# Patient Record
Sex: Male | Born: 1990 | Race: Black or African American | Hispanic: No | Marital: Single | Smoking: Never smoker
Health system: Southern US, Community
[De-identification: ages and names within clinical notes are randomized; demographics above are authoritative.]

## PROBLEM LIST (undated history)

## (undated) DIAGNOSIS — R198 Other specified symptoms and signs involving the digestive system and abdomen: Secondary | ICD-10-CM

## (undated) DIAGNOSIS — F419 Anxiety disorder, unspecified: Secondary | ICD-10-CM

## (undated) DIAGNOSIS — F79 Unspecified intellectual disabilities: Secondary | ICD-10-CM

## (undated) DIAGNOSIS — R229 Localized swelling, mass and lump, unspecified: Secondary | ICD-10-CM

## (undated) DIAGNOSIS — R1310 Dysphagia, unspecified: Secondary | ICD-10-CM

## (undated) DIAGNOSIS — Z789 Other specified health status: Secondary | ICD-10-CM

## (undated) DIAGNOSIS — J189 Pneumonia, unspecified organism: Secondary | ICD-10-CM

## (undated) DIAGNOSIS — R479 Unspecified speech disturbances: Secondary | ICD-10-CM

## (undated) HISTORY — PX: MULTIPLE TOOTH EXTRACTIONS: SHX2053

---

## 2012-09-28 ENCOUNTER — Emergency Department (HOSPITAL_COMMUNITY)
Admission: EM | Admit: 2012-09-28 | Discharge: 2012-09-28 | Disposition: A | Discharge status: Home / Self Care | Payer: Medicaid Other | Source: Home / Self Care

## 2012-09-28 ENCOUNTER — Encounter (HOSPITAL_COMMUNITY): Payer: Self-pay | Admitting: *Deleted

## 2012-09-28 ENCOUNTER — Emergency Department (INDEPENDENT_AMBULATORY_CARE_PROVIDER_SITE_OTHER): Payer: Medicaid Other

## 2012-09-28 DIAGNOSIS — J189 Pneumonia, unspecified organism: Secondary | ICD-10-CM

## 2012-09-28 MED ORDER — AZITHROMYCIN 250 MG PO TABS: 250.0000 mg | ORAL_TABLET | Freq: Every day | ORAL | Status: DC

## 2012-09-28 MED ORDER — CEFTRIAXONE SODIUM 1 G IJ SOLR
1.0000 g | Freq: Once | INTRAMUSCULAR | Status: AC
  Administered 2012-09-28: 1 g via INTRAMUSCULAR

## 2012-09-28 MED ORDER — CEFTRIAXONE SODIUM 1 G IJ SOLR
INTRAMUSCULAR | Status: AC
  Filled 2012-09-28: qty 10

## 2012-09-28 NOTE — ED Provider Notes (Signed)
Medical screening examination/treatment/procedure(s) were performed by resident physician or non-physician practitioner and as supervising physician I was immediately available for consultation/collaboration.   Barkley Bruns MD.   Linna Hoff, MD 09/28/12 [phone]

## 2012-09-28 NOTE — ED Notes (Signed)
Pt  Has  Symptoms  Of  Fever  Cough   Congestion  For  sev   Days           He  Also  Has  Had  Some  Vomiting as  Well  Caregiver  With the  patient

## 2012-09-28 NOTE — ED Provider Notes (Signed)
History     CSN: [id number]  Arrival date & time 09/28/12  1103   First MD Initiated Contact with Patient 09/28/12 1116      Chief Complaint  Patient presents with  . 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 Last night he was complaining of upper mid chest pain. The highest is home temperature measured was 101. He has had no diarrhea or abdominal pain he also developed a cough last p.m.   Past Medical History  Diagnosis Date  . Retardation     History reviewed. No pertinent past surgical history.  History reviewed. No pertinent family history.  History  Substance Use Topics  . Smoking status: Not on file  . Smokeless tobacco: Not on file  . Alcohol Use: No      Review of Systems  Unable to perform ROS Constitutional: Positive for fever and activity change. Negative for chills.    Allergies  Review of patient's allergies indicates no known allergies.  Home Medications   Current Outpatient Rx  Name  Route  Sig  Dispense  Refill  . azithromycin (ZITHROMAX) 250 MG tablet   Oral   Take 1 tablet (250 mg total) by mouth daily. 2 tabs po on day 1, 1 tab po on days 2-5   6 tablet   0     BP 118/72  Pulse 102  Temp(Src) 100.5 F (38.1 C) (Oral)  Resp 18  SpO2 98%  Physical Exam  Nursing note and vitals reviewed. Constitutional: He is oriented to person, place, and time. He appears well-developed and well-nourished. No distress.  HENT:  Head: Normocephalic.  Right Ear: External ear normal.  Left Ear: External ear normal.  Patient is uncooperative and will not allow exam, the oropharynx.  Neck: Normal range of motion. Neck supple.  Cardiovascular: Normal rate, regular rhythm and normal heart sounds.   Pulmonary/Chest: Effort  normal and breath sounds normal. No respiratory distress. He has no wheezes. He has no rales.  Abdominal: Soft. There is no tenderness. There is no rebound.  Musculoskeletal: Normal range of motion. He exhibits no edema and no tenderness.  Lymphadenopathy:    He has no cervical adenopathy.  Neurological: He is alert and oriented to person, place, and time.  Skin: Skin is warm and dry. No rash noted.  Psychiatric: He has a normal mood and affect.    ED Course  Procedures (including critical care time)  Labs Reviewed - No data to display Dg Chest 2 View  09/28/2012  *RADIOLOGY REPORT*  Clinical Data: Fever, cough  CHEST - 2 VIEW  Comparison: None.  Findings: Cardiomediastinal silhouette is stable.  There is patchy airspace disease in the left lower lobe highly suspicious for infiltrate/pneumonia.  Follow-up to resolution after treatment is recommended.  IMPRESSION: Patchy airspace disease in the left lower lobe highly suspicious for pneumonia.  Follow-up to resolution after treatment is recommended.   Original Report Authenticated By: Natasha Mead, M.D.      1. Pneumonia       MDM  Rocephin 1 g IM prior to discharge Azithromycin-Z-Pak Make sure all the medicine is completed. Drink plenty of fluids stay well hydrated Tylenol every 4 hours as needed for fever and discomfort Followup with his doctor next week for recheck. Any new symptoms problems or worsening may return or go to the emergency department.  Hayden Rasmussen, NP 09/28/12 1249

## 2013-10-06 ENCOUNTER — Emergency Department (HOSPITAL_COMMUNITY)
Admission: EM | Admit: 2013-10-06 | Discharge: 2013-10-06 | Disposition: A | Discharge status: Home / Self Care | Payer: Medicaid Other | Source: Home / Self Care | Attending: Family Medicine | Admitting: Family Medicine

## 2013-10-06 ENCOUNTER — Encounter (HOSPITAL_COMMUNITY): Payer: Self-pay | Admitting: Emergency Medicine

## 2013-10-06 DIAGNOSIS — K529 Noninfective gastroenteritis and colitis, unspecified: Secondary | ICD-10-CM

## 2013-10-06 DIAGNOSIS — K5289 Other specified noninfective gastroenteritis and colitis: Secondary | ICD-10-CM

## 2013-10-06 MED ORDER — ONDANSETRON 4 MG PO TBDP
4.0000 mg | ORAL_TABLET | Freq: Once | ORAL | Status: AC
  Administered 2013-10-06: 4 mg via ORAL

## 2013-10-06 MED ORDER — ONDANSETRON 4 MG PO TBDP
ORAL_TABLET | ORAL | Status: AC
  Filled 2013-10-06: qty 1

## 2013-10-06 MED ORDER — ONDANSETRON HCL 4 MG PO TABS: 4.0000 mg | ORAL_TABLET | Freq: Four times a day (QID) | ORAL | Status: DC

## 2013-10-06 NOTE — ED Notes (Signed)
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

## 2013-10-06 NOTE — Discharge Instructions (Signed)
Clear liquid , bland diet tonight as tolerated, advance on tues as improved, use medicine as needed, return or see your doctor if any problems. °

## 2013-10-06 NOTE — ED Provider Notes (Signed)
CSN: [id number]     Arrival date & time 10/06/13  1824 History   First MD Initiated Contact with Patient 10/06/13 1957     Chief Complaint  Patient presents with  . Fever   (Consider location/radiation/quality/duration/timing/severity/associated sxs/prior Treatment) Patient is a 23 y.o. male presenting with vomiting. The history is provided by a parent.  Emesis Severity:  Mild Duration:  1 day Quality:  Stomach contents Progression:  Improving Chronicity:  New Recent urination:  Normal Associated symptoms: fever   Associated symptoms: no abdominal pain, no cough, no diarrhea and no sore throat     Past Medical History  Diagnosis Date  . Retardation    History reviewed. No pertinent past surgical history. History reviewed. No pertinent family history. History  Substance Use Topics  . Smoking status: Never Smoker   . Smokeless tobacco: Not on file  . Alcohol Use: No    Review of Systems  Constitutional: Positive for fever.  HENT: Negative.  Negative for sore throat.   Gastrointestinal: Positive for nausea and vomiting. Negative for abdominal pain, diarrhea and constipation.  Genitourinary: Negative.     Allergies  Review of patient's allergies indicates no known allergies.  Home Medications   Current Outpatient Rx  Name  Route  Sig  Dispense  Refill  . azithromycin (ZITHROMAX) 250 MG tablet   Oral   Take 1 tablet (250 mg total) by mouth daily. 2 tabs po on day 1, 1 tab po on days 2-5   6 tablet   0   . ondansetron (ZOFRAN) 4 MG tablet   Oral   Take 1 tablet (4 mg total) by mouth every 6 (six) hours. Prn n/v   6 tablet   0    BP 123/71  Pulse 92  Temp(Src) 98.6 F (37 C) (Oral)  Resp 16  SpO2 98% Physical Exam  Nursing note and vitals reviewed. Constitutional: He is oriented to person, place, and time. He appears well-developed and well-nourished. No distress.  HENT:  Mouth/Throat: Oropharynx is clear and moist.  Eyes: Pupils are equal, round, and  reactive to light.  Neck: Normal range of motion. Neck supple.  Cardiovascular: Normal heart sounds and intact distal pulses.   Pulmonary/Chest: Effort normal and breath sounds normal.  Abdominal: Soft. Bowel sounds are normal. He exhibits no distension and no mass. There is no tenderness. There is no rebound and no guarding.  Lymphadenopathy:    He has no cervical adenopathy.  Neurological: He is alert and oriented to person, place, and time.  Skin: Skin is warm and dry.    ED Course  Procedures (including critical care time) Labs Review Labs Reviewed - No data to display Imaging Review No results found.   MDM   1. Gastroenteritis, acute        Linna HoffJames D Yida Hyams, MD 10/06/13 2038

## 2014-04-07 DIAGNOSIS — IMO0002 Reserved for concepts with insufficient information to code with codable children: Secondary | ICD-10-CM

## 2014-04-07 HISTORY — DX: Reserved for concepts with insufficient information to code with codable children: IMO0002

## 2014-04-16 ENCOUNTER — Encounter (HOSPITAL_BASED_OUTPATIENT_CLINIC_OR_DEPARTMENT_OTHER): Payer: Self-pay | Admitting: *Deleted

## 2014-04-20 ENCOUNTER — Ambulatory Visit (HOSPITAL_BASED_OUTPATIENT_CLINIC_OR_DEPARTMENT_OTHER): Payer: Medicaid Other | Admitting: Anesthesiology

## 2014-04-20 ENCOUNTER — Ambulatory Visit (HOSPITAL_BASED_OUTPATIENT_CLINIC_OR_DEPARTMENT_OTHER)
Admission: RE | Admit: 2014-04-20 | Discharge: 2014-04-20 | Disposition: A | Discharge status: Home / Self Care | Payer: Medicaid Other | Source: Ambulatory Visit | Attending: Specialist | Admitting: Specialist

## 2014-04-20 ENCOUNTER — Encounter (HOSPITAL_BASED_OUTPATIENT_CLINIC_OR_DEPARTMENT_OTHER)
Admission: RE | Disposition: A | Discharge status: Home / Self Care | Payer: Self-pay | Source: Ambulatory Visit | Attending: Specialist

## 2014-04-20 ENCOUNTER — Encounter (HOSPITAL_BASED_OUTPATIENT_CLINIC_OR_DEPARTMENT_OTHER): Payer: Medicaid Other | Admitting: Anesthesiology

## 2014-04-20 ENCOUNTER — Encounter (HOSPITAL_BASED_OUTPATIENT_CLINIC_OR_DEPARTMENT_OTHER): Payer: Self-pay | Admitting: *Deleted

## 2014-04-20 DIAGNOSIS — L723 Sebaceous cyst: Secondary | ICD-10-CM | POA: Insufficient documentation

## 2014-04-20 DIAGNOSIS — F79 Unspecified intellectual disabilities: Secondary | ICD-10-CM | POA: Insufficient documentation

## 2014-04-20 HISTORY — DX: Localized swelling, mass and lump, unspecified: R22.9

## 2014-04-20 HISTORY — DX: Unspecified intellectual disabilities: F79

## 2014-04-20 HISTORY — PX: MASS EXCISION: SHX2000

## 2014-04-20 HISTORY — DX: Dysphagia, unspecified: R13.10

## 2014-04-20 HISTORY — DX: Other specified symptoms and signs involving the digestive system and abdomen: R19.8

## 2014-04-20 HISTORY — DX: Unspecified speech disturbances: R47.9

## 2014-04-20 SURGERY — EXCISION MASS
Anesthesia: General | Laterality: Bilateral

## 2014-04-20 MED ORDER — PROPOFOL 10 MG/ML IV BOLUS
INTRAVENOUS | Status: DC | PRN
  Administered 2014-04-20: 120 mg via INTRAVENOUS

## 2014-04-20 MED ORDER — FENTANYL CITRATE 0.05 MG/ML IJ SOLN
INTRAMUSCULAR | Status: AC
  Filled 2014-04-20: qty 4

## 2014-04-20 MED ORDER — MIDAZOLAM HCL 2 MG/ML PO SYRP: 12.0000 mg | ORAL_SOLUTION | Freq: Once | ORAL | Status: DC | PRN

## 2014-04-20 MED ORDER — KETAMINE HCL 100 MG/ML IJ SOLN
INTRAMUSCULAR | Status: AC
  Filled 2014-04-20: qty 1

## 2014-04-20 MED ORDER — CEFAZOLIN SODIUM-DEXTROSE 2-3 GM-% IV SOLR
2.0000 g | INTRAVENOUS | Status: AC
Start: 2014-04-20 — End: 2014-04-20
  Administered 2014-04-20: 2 g via INTRAVENOUS

## 2014-04-20 MED ORDER — ONDANSETRON HCL 4 MG/2ML IJ SOLN
INTRAMUSCULAR | Status: DC | PRN
  Administered 2014-04-20: 4 mg via INTRAVENOUS

## 2014-04-20 MED ORDER — LIDOCAINE-EPINEPHRINE 0.5 %-1:200000 IJ SOLN
INTRAMUSCULAR | Status: DC | PRN
  Administered 2014-04-20: 15 mL

## 2014-04-20 MED ORDER — FENTANYL CITRATE 0.05 MG/ML IJ SOLN
INTRAMUSCULAR | Status: DC | PRN
  Administered 2014-04-20: 50 ug via INTRAVENOUS

## 2014-04-20 MED ORDER — LACTATED RINGERS IV SOLN
INTRAVENOUS | Status: DC
  Administered 2014-04-20: 08:00:00 via INTRAVENOUS

## 2014-04-20 MED ORDER — SUCCINYLCHOLINE CHLORIDE 20 MG/ML IJ SOLN
INTRAMUSCULAR | Status: DC | PRN
  Administered 2014-04-20: 100 mg via INTRAVENOUS

## 2014-04-20 MED ORDER — DEXAMETHASONE SODIUM PHOSPHATE 4 MG/ML IJ SOLN
INTRAMUSCULAR | Status: DC | PRN
  Administered 2014-04-20: 8 mg via INTRAVENOUS

## 2014-04-20 MED ORDER — MIDAZOLAM HCL 2 MG/2ML IJ SOLN
INTRAMUSCULAR | Status: AC
  Filled 2014-04-20: qty 2

## 2014-04-20 MED ORDER — LIDOCAINE-EPINEPHRINE 0.5 %-1:200000 IJ SOLN
INTRAMUSCULAR | Status: AC
  Filled 2014-04-20: qty 2

## 2014-04-20 MED ORDER — FENTANYL CITRATE 0.05 MG/ML IJ SOLN: 50.0000 ug | INTRAMUSCULAR | Status: DC | PRN

## 2014-04-20 MED ORDER — KETAMINE HCL 100 MG/ML IJ SOLN
INTRAMUSCULAR | Status: DC | PRN
  Administered 2014-04-20: 250 mg via INTRAMUSCULAR

## 2014-04-20 MED ORDER — LIDOCAINE HCL (CARDIAC) 20 MG/ML IV SOLN
INTRAVENOUS | Status: DC | PRN
  Administered 2014-04-20: 50 mg via INTRAVENOUS

## 2014-04-20 MED ORDER — MIDAZOLAM HCL 5 MG/5ML IJ SOLN
INTRAMUSCULAR | Status: DC | PRN
  Administered 2014-04-20: 2.5 mg via INTRAMUSCULAR

## 2014-04-20 MED ORDER — BACITRACIN-NEOMYCIN-POLYMYXIN 400-5-5000 EX OINT
TOPICAL_OINTMENT | CUTANEOUS | Status: AC
  Filled 2014-04-20: qty 1

## 2014-04-20 MED ORDER — MIDAZOLAM HCL 2 MG/2ML IJ SOLN: 1.0000 mg | INTRAMUSCULAR | Status: DC | PRN

## 2014-04-20 MED ORDER — HYDROMORPHONE HCL PF 1 MG/ML IJ SOLN: 0.2500 mg | INTRAMUSCULAR | Status: DC | PRN

## 2014-04-20 MED ORDER — CEFAZOLIN SODIUM-DEXTROSE 2-3 GM-% IV SOLR
INTRAVENOUS | Status: AC
Start: 2014-04-20 — End: 2014-04-20
  Filled 2014-04-20: qty 50

## 2014-04-20 SURGICAL SUPPLY — 65 items
BAG DECANTER FOR FLEXI CONT (MISCELLANEOUS) ×3 IMPLANT
BANDAGE ELASTIC 4 VELCRO ST LF (GAUZE/BANDAGES/DRESSINGS) IMPLANT
BENZOIN TINCTURE PRP APPL 2/3 (GAUZE/BANDAGES/DRESSINGS) IMPLANT
BLADE KNIFE PERSONA 10 (BLADE) ×3 IMPLANT
BLADE KNIFE PERSONA 15 (BLADE) ×3 IMPLANT
BLADE SURG 11 STRL SS (BLADE) ×3 IMPLANT
BNDG COHESIVE 4X5 TAN STRL (GAUZE/BANDAGES/DRESSINGS) IMPLANT
BNDG COHESIVE 4X5 WHT NS (GAUZE/BANDAGES/DRESSINGS) ×6 IMPLANT
BNDG GAUZE ELAST 4 BULKY (GAUZE/BANDAGES/DRESSINGS) IMPLANT
CANISTER SUCT 1200ML W/VALVE (MISCELLANEOUS) ×3 IMPLANT
CLOSURE WOUND 1/2 X4 (GAUZE/BANDAGES/DRESSINGS)
COTTONBALL LRG STERILE PKG (GAUZE/BANDAGES/DRESSINGS) IMPLANT
COVER MAYO STAND STRL (DRAPES) ×3 IMPLANT
COVER TABLE BACK 60X90 (DRAPES) ×3 IMPLANT
DRAPE LAPAROSCOPIC ABDOMINAL (DRAPES) IMPLANT
DRAPE U-SHAPE 76X120 STRL (DRAPES) ×3 IMPLANT
DRSG PAD ABDOMINAL 8X10 ST (GAUZE/BANDAGES/DRESSINGS) ×3 IMPLANT
ELECT NEEDLE TIP 2.8 STRL (NEEDLE) ×3 IMPLANT
ELECT REM PT RETURN 9FT ADLT (ELECTROSURGICAL) ×3
ELECTRODE REM PT RTRN 9FT ADLT (ELECTROSURGICAL) ×1 IMPLANT
FILTER 7/8 IN (FILTER) IMPLANT
GAUZE SPONGE 4X4 12PLY STRL (GAUZE/BANDAGES/DRESSINGS) ×3 IMPLANT
GAUZE SPONGE 4X4 16PLY XRAY LF (GAUZE/BANDAGES/DRESSINGS) IMPLANT
GAUZE XEROFORM 1X8 LF (GAUZE/BANDAGES/DRESSINGS) ×3 IMPLANT
GAUZE XEROFORM 5X9 LF (GAUZE/BANDAGES/DRESSINGS) IMPLANT
GLOVE BIO SURGEON STRL SZ 6.5 (GLOVE) ×2 IMPLANT
GLOVE BIO SURGEONS STRL SZ 6.5 (GLOVE) ×1
GLOVE BIOGEL M STRL SZ7.5 (GLOVE) ×3 IMPLANT
GLOVE BIOGEL PI IND STRL 8 (GLOVE) ×1 IMPLANT
GLOVE BIOGEL PI INDICATOR 8 (GLOVE) ×2
GLOVE ECLIPSE 7.0 STRL STRAW (GLOVE) ×3 IMPLANT
GOWN STRL REUS W/ TWL LRG LVL3 (GOWN DISPOSABLE) IMPLANT
GOWN STRL REUS W/ TWL XL LVL3 (GOWN DISPOSABLE) ×2 IMPLANT
GOWN STRL REUS W/TWL LRG LVL3 (GOWN DISPOSABLE)
GOWN STRL REUS W/TWL XL LVL3 (GOWN DISPOSABLE) ×4
NEEDLE HYPO 25X1 1.5 SAFETY (NEEDLE) ×3 IMPLANT
PACK BASIN DAY SURGERY FS (CUSTOM PROCEDURE TRAY) ×3 IMPLANT
SHEET MEDIUM DRAPE 40X70 STRL (DRAPES) IMPLANT
SHEETING SILICONE GEL EPI DERM (MISCELLANEOUS) IMPLANT
SLEEVE SCD COMPRESS KNEE MED (MISCELLANEOUS) ×3 IMPLANT
SPONGE GAUZE 4X4 12PLY STER LF (GAUZE/BANDAGES/DRESSINGS) IMPLANT
SPONGE LAP 18X18 X RAY DECT (DISPOSABLE) ×3 IMPLANT
STAPLER VISISTAT 35W (STAPLE) IMPLANT
STOCKINETTE 4X48 STRL (DRAPES) IMPLANT
STRIP CLOSURE SKIN 1/2X4 (GAUZE/BANDAGES/DRESSINGS) IMPLANT
SUCTION FRAZIER TIP 10 FR DISP (SUCTIONS) ×3 IMPLANT
SUT ETHILON 5 0 PS 2 18 (SUTURE) ×3 IMPLANT
SUT MNCRL AB 3-0 PS2 18 (SUTURE) ×3 IMPLANT
SUT MON AB 2-0 CT1 36 (SUTURE) IMPLANT
SUT PROLENE 4 0 P 3 18 (SUTURE) IMPLANT
SUT PROLENE 4 0 PS 2 18 (SUTURE) ×3 IMPLANT
SUT SILK 3 0 PS 1 (SUTURE) IMPLANT
SUT VICRYL RAPID 5 0 P 3 (SUTURE) ×6 IMPLANT
SYR 20CC LL (SYRINGE) ×3 IMPLANT
SYR 50ML LL SCALE MARK (SYRINGE) IMPLANT
SYR CONTROL 10ML LL (SYRINGE) ×3 IMPLANT
TAPE HYPAFIX 6 X30' (GAUZE/BANDAGES/DRESSINGS)
TAPE HYPAFIX 6X30 (GAUZE/BANDAGES/DRESSINGS) IMPLANT
TOWEL OR 17X24 6PK STRL BLUE (TOWEL DISPOSABLE) ×6 IMPLANT
TRAY DSU PREP LF (CUSTOM PROCEDURE TRAY) ×3 IMPLANT
TUBE CONNECTING 20'X1/4 (TUBING)
TUBE CONNECTING 20X1/4 (TUBING) IMPLANT
UNDERPAD 30X30 INCONTINENT (UNDERPADS AND DIAPERS) IMPLANT
VAC PENCILS W/TUBING CLEAR (MISCELLANEOUS) ×3 IMPLANT
YANKAUER SUCT BULB TIP NO VENT (SUCTIONS) ×3 IMPLANT

## 2014-04-20 NOTE — H&P (Signed)
Bobby Huber is an 23 y.o. male.   Chief Complaint: Patient with mental retardation and masses right and left ears [id number] masses right and left ears with increased growth  Past Medical History  Diagnosis Date  . Mental retardation   . Mass 04/2014    bilateral ear lobe  . Speech impediment     speech is not clear, difficult to understand  . Difficulty swallowing pills     Past Surgical History  Procedure Laterality Date  . Multiple tooth extractions      History reviewed. No pertinent family history. Social History:  reports that he has never smoked. He has never used smokeless tobacco. He reports that he does not drink alcohol or use illicit drugs.  Allergies: No Known Allergies  No prescriptions prior to admission    No results found for this or any previous visit (from the past 48 hour(s)). No results found.  Review of Systems  Constitutional: Negative.   HENT: Negative.   Eyes: Negative.   Respiratory: Negative.   Cardiovascular: Negative.   Gastrointestinal: Negative.   Genitourinary: Negative.   Musculoskeletal: Negative.   Skin: Negative.   Neurological: Negative.   Endo/Heme/Allergies: Negative.   Psychiatric/Behavioral: The patient is nervous/anxious.     Blood pressure 124/81, pulse 78, temperature 98 F (36.7 C), temperature source Oral, resp. rate 16, height  (1.702 m), weight 74.844 kg (165 lb), SpO2 100.00%. Physical Exam   Assessment/Plan Masses right and left ears for excision and plastic closure  Bobby Huber 04/20/2014, 7:25 AM

## 2014-04-20 NOTE — Anesthesia Postprocedure Evaluation (Signed)
  Anesthesia Post-op Note  Patient: [employer]  Procedure(s) Performed: Procedure(s): EXCISION OF  MASSES BILATERAL POSTERIOR EAR LOBE WITH PLASTIC CLOSURE (Bilateral)  Patient Location: PACU  Anesthesia Type:General  Level of Consciousness: awake and alert   Airway and Oxygen Therapy: Patient Spontanous Breathing  Post-op Pain: none  Post-op Assessment: Post-op Vital signs reviewed, Patient's Cardiovascular Status Stable and Respiratory Function Stable  Post-op Vital Signs: Reviewed  Filed Vitals:   04/20/14 0915  BP: 128/68  Pulse: 120  Temp:   Resp: 18    Complications: No apparent anesthesia complications

## 2014-04-20 NOTE — Anesthesia Procedure Notes (Signed)
Procedure Name: Intubation Date/Time: 04/20/2014 7:45 AM Performed by: Caren Macadam Pre-anesthesia Checklist: Patient identified, Emergency Drugs available, Suction available and Patient being monitored Patient Re-evaluated:Patient Re-evaluated prior to inductionOxygen Delivery Method: Circle System Utilized Preoxygenation: Pre-oxygenation with 100% oxygen Intubation Type: IV induction Ventilation: Mask ventilation without difficulty Laryngoscope Size: Miller and 2 Grade View: Grade I Tube type: Oral Tube size: 7.0 mm Number of attempts: 1 Airway Equipment and Method: stylet and oral airway Placement Confirmation: ETT inserted through vocal cords under direct vision,  positive ETCO2 and breath sounds checked- equal and bilateral Secured at: 24 cm Tube secured with: Tape Dental Injury: Teeth and Oropharynx as per pre-operative assessment

## 2014-04-20 NOTE — Brief Op Note (Signed)
04/20/2014  8:35 AM  PATIENT:  Bobby Huber  24 y.o. 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, face, and neck   POST-OPERATIVE DIAGNOSIS:  Other benign neoplasm of connective and other soft tissue o  PROCEDURE:  Procedure(s): EXCISION OF  MASSES BILATERAL POSTERIOR EAR LOBE WITH PLASTIC CLOSURE (Bilateral)  SURGEON:  Surgeon(s) and Role:    * Louisa Second, MD - Primary  PHYSICIAN ASSISTANT:   ASSISTANTS: none   ANESTHESIA:   general  EBL:  Total I/O In: 600 [I.V.:600] Out: -   BLOOD ADMINISTERED:none  DRAINS: none   LOCAL MEDICATIONS USED:  LIDOCAINE   SPECIMEN:  Excision  DISPOSITION OF SPECIMEN:  PATHOLOGY  COUNTS:  YES  TOURNIQUET:  * No tourniquets in log *  DICTATION: .Other Dictation: Dictation Number [id number]  PLAN OF CARE: Discharge to home after PACU  PATIENT DISPOSITION:  PACU - hemodynamically stable.   Delay start of Pharmacological VTE agent (>24hrs) due to surgical blood loss or risk of bleeding: not applicable

## 2014-04-20 NOTE — Transfer of Care (Signed)
Immediate Anesthesia Transfer of Care Note  Patient: Bobby Huber  Procedure(s) Performed: Procedure(s): EXCISION OF  MASSES BILATERAL POSTERIOR EAR LOBE WITH PLASTIC CLOSURE (Bilateral)  Patient Location: PACU  Anesthesia Type:General  Level of Consciousness: sedated  Airway & Oxygen Therapy: Patient Spontanous Breathing and Patient connected to face mask oxygen  Post-op Assessment: Report given to PACU RN and Post -op Vital signs reviewed and stable  Post vital signs: Reviewed and stable  Complications: No apparent anesthesia complications

## 2014-04-20 NOTE — Discharge Instructions (Signed)
Activity (include date of return to work if known) As tolerated: NO showers NO driving No heavy activities  Diet:regular No restrictions:  Wound Care: Keep dressing clean & dry  Do not change dressings For Abdominoplasties wear abdominal binder Special Instructions: Do not raise arms up Continue to empty, recharge, & record drainage from J-P drains &/or Hemovacs 2-3 times a day, as needed. Call Doctor if any unusual problems occur such as pain, excessive Bleeding, unrelieved Nausea/vomiting, Fever &/or chills When lying down, keep head elevated on 2-3 pillows or back-rest For Addominoplasties the Jack-knife position Follow-up appointment: Please call the office.  The patient received discharge instruction from:___________________________________________      Patient signature ________________________________________ / Date___________    Signature of individual providing instructions/ Date________________             Post Anesthesia Home Care Instructions  Activity: Get plenty of rest for the remainder of the day. A responsible adult should stay with you for 24 hours following the procedure.  For the next 24 hours, DO NOT: -Drive a car -Advertising copywriter -Drink alcoholic beverages -Take any medication unless instructed by your physician -Make any legal decisions or sign important papers.  Meals: Start with liquid foods such as gelatin or soup. Progress to regular foods as tolerated. Avoid greasy, spicy, heavy foods. If nausea and/or vomiting occur, drink only clear liquids until the nausea and/or vomiting subsides. Call your physician if vomiting continues.  Special Instructions/Symptoms: Your throat may feel dry or sore from the anesthesia or the breathing tube placed in your throat during surgery. If this causes discomfort, gargle with warm salt water. The discomfort should disappear within 24 hours.    Make an appointment with Dr. Shon Hough next Tuesday September  22.

## 2014-04-20 NOTE — Anesthesia Preprocedure Evaluation (Signed)
Anesthesia Evaluation  Patient identified by MRN, date of birth, ID band Patient awake    Reviewed: Allergy & Precautions, H&P , NPO status , Patient's Chart, lab work & pertinent test results  Airway       Dental no notable dental hx.    Pulmonary neg pulmonary ROS,    Pulmonary exam normal       Cardiovascular negative cardio ROS      Neuro/Psych Mental retardation negative psych ROS   GI/Hepatic negative GI ROS, Neg liver ROS,   Endo/Other  negative endocrine ROS  Renal/GU negative Renal ROS  negative genitourinary   Musculoskeletal   Abdominal   Peds  Hematology negative hematology ROS (+)   Anesthesia Other Findings   Reproductive/Obstetrics negative OB ROS                           Anesthesia Physical Anesthesia Plan  ASA: III  Anesthesia Plan: General   Post-op Pain Management:    Induction: Inhalational  Airway Management Planned: LMA  Additional Equipment:   Intra-op Plan:   Post-operative Plan: Extubation in OR  Informed Consent: I have reviewed the patients History and Physical, chart, labs and discussed the procedure including the risks, benefits and alternatives for the proposed anesthesia with the patient or authorized representative who has indicated his/her understanding and acceptance.   Dental advisory given  Plan Discussed with: CRNA  Anesthesia Plan Comments:         Anesthesia Quick Evaluation

## 2014-04-21 ENCOUNTER — Encounter (HOSPITAL_BASED_OUTPATIENT_CLINIC_OR_DEPARTMENT_OTHER): Payer: Self-pay | Admitting: Specialist

## 2014-04-21 NOTE — Op Note (Deleted)
NAME:  Bobby Huber, Bobby Huber              ACCOUNT NO.:  [id number]  MEDICAL RECORD NO.:  [id number]  LOCATION:  [facility]                         FACILITY:  [facility]  PHYSICIAN:  Yasmene Salomone L. Giovanne Nickolson, M.D.DATE OF BIRTH:  04/26/1991  DATE OF PROCEDURE:  04/20/2014 DATE OF DISCHARGE:  04/20/2013                              OPERATIVE REPORT   A 23-year-old gentleman who has mental retardation, has masses involving his right and left posterior ear areas with increased drainage, pain and discomfort over the last few months.  Examination reveals areas to have consistency of severe sebaceous cyst left side is greater in size than the right.  PROCEDURES PLANNED:  Excision of areas and plaster reconstruction of the defect with flap advancement.  ANESTHESIA:  General.  Preoperatively, the patient underwent ketamine IM for relaxation and to allow the staff to place an IV. He was then taken to the operating room and placed on the operating room table in the supine position, was given adequate general anesthesia, intubated orally.  Prep was done to the facial and neck areas with Betadine solution walled off with sterile towels and drapes so as to make a sterile field.  A 0.5% Xylocaine with epinephrine was injected locally 1:200,000 concentration a total of 10 mL of both sides. This was allowed to set up.  Marker pens that were used to outline the excision of the areas horizontally. A #15 blade was used to dissect down to the skin and then using Stevens scissors, I was able to dissect out the masses without difficulty.  The defects was very impressive after there was some 1 x 1.5 cm.  I was able to use to posterior flap to relax and advance and stay with 5-0 Monocryl suture and then the skin edges were approximated with multiple sutures of Rapide 5-0.  The same procedure was carried out on both sides.  The wounds were cleansed.  Half inch Steri-Strips and soft dressing applied to both sides.  He tolerated  the procedures very well.  ESTIMATED BLOOD LOSS:  Less than 20 mL.  COMPLICATIONS:  None.     Milynn Quirion L. Lanisha Stepanian, M.D.     GLT/MEDQ  D:  04/20/2014  T:  04/21/2014  Job:  [id number] 

## 2014-04-21 NOTE — Op Note (Signed)
NAMEAVIEN, TAHA              ACCOUNT NO.:  [id number]  MEDICAL RECORD NO.:  [id number]  LOCATION:  [facility]                         FACILITY:  [facility]  PHYSICIAN:  Earvin Hansen L. Jennings Stirling, M.D.DATE OF BIRTH:  May 28, 1991  DATE OF PROCEDURE:  04/20/2014 DATE OF DISCHARGE:  04/20/2013                              OPERATIVE REPORT   A 23 year old gentleman who has mental retardation, has masses involving his right and left posterior ear areas with increased drainage, pain and discomfort over the last few months.  Examination reveals areas to have consistency of severe sebaceous cyst left side is greater in size than the right.  PROCEDURES PLANNED:  Excision of areas and plaster reconstruction of the defect with flap advancement.  ANESTHESIA:  General.  Preoperatively, the patient underwent ketamine IM for relaxation and to allow the staff to place an IV. He was then taken to the operating room and placed on the operating room table in the supine position, was given adequate general anesthesia, intubated orally.  Prep was done to the facial and neck areas with Betadine solution walled off with sterile towels and drapes so as to make a sterile field.  A 0.5% Xylocaine with epinephrine was injected locally 1:200,000 concentration a total of 10 mL of both sides. This was allowed to set up.  Marker pens that were used to outline the excision of the areas horizontally. A #15 blade was used to dissect down to the skin and then using Stevens scissors, I was able to dissect out the masses without difficulty.  The defects was very impressive after there was some 1 x 1.5 cm.  I was able to use to posterior flap to relax and advance and stay with 5-0 Monocryl suture and then the skin edges were approximated with multiple sutures of Rapide 5-0.  The same procedure was carried out on both sides.  The wounds were cleansed.  Half inch Steri-Strips and soft dressing applied to both sides.  He tolerated  the procedures very well.  ESTIMATED BLOOD LOSS:  Less than 20 mL.  COMPLICATIONS:  None.     Yaakov Guthrie. Shon Hough, M.D.     Cathie Hoops  D:  04/20/2014  T:  04/21/2014  Job:  [id number]

## 2016-04-25 ENCOUNTER — Encounter (HOSPITAL_COMMUNITY): Payer: Self-pay | Admitting: Emergency Medicine

## 2016-04-25 ENCOUNTER — Ambulatory Visit (HOSPITAL_COMMUNITY)
Admission: EM | Admit: 2016-04-25 | Discharge: 2016-04-25 | Disposition: A | Discharge status: Home / Self Care | Payer: Medicaid Other | Attending: Internal Medicine | Admitting: Internal Medicine

## 2016-04-25 DIAGNOSIS — M791 Myalgia: Secondary | ICD-10-CM | POA: Diagnosis not present

## 2016-04-25 DIAGNOSIS — M546 Pain in thoracic spine: Secondary | ICD-10-CM | POA: Diagnosis not present

## 2016-04-25 DIAGNOSIS — M7918 Myalgia, other site: Secondary | ICD-10-CM

## 2016-04-25 MED ORDER — IBUPROFEN 800 MG PO TABS: 800.0000 mg | ORAL_TABLET | Freq: Three times a day (TID) | ORAL | 0 refills | Status: DC

## 2016-04-25 MED ORDER — CYCLOBENZAPRINE HCL 10 MG PO TABS: 10.0000 mg | ORAL_TABLET | Freq: Every evening | ORAL | 0 refills | Status: AC | PRN
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
## 2016-05-25 ENCOUNTER — Ambulatory Visit (HOSPITAL_COMMUNITY)
Admission: EM | Admit: 2016-05-25 | Discharge: 2016-05-25 | Disposition: A | Discharge status: Home / Self Care | Payer: Medicaid Other | Attending: Family Medicine | Admitting: Family Medicine

## 2016-05-25 ENCOUNTER — Encounter (HOSPITAL_COMMUNITY): Payer: Self-pay | Admitting: Family Medicine

## 2016-05-25 DIAGNOSIS — N3090 Cystitis, unspecified without hematuria: Secondary | ICD-10-CM | POA: Insufficient documentation

## 2016-05-25 DIAGNOSIS — R300 Dysuria: Secondary | ICD-10-CM | POA: Diagnosis present

## 2016-05-25 DIAGNOSIS — Z79899 Other long term (current) drug therapy: Secondary | ICD-10-CM | POA: Diagnosis not present

## 2016-05-25 LAB — POCT URINALYSIS DIP (DEVICE)
Bilirubin Urine: NEGATIVE
GLUCOSE, UA: NEGATIVE mg/dL
Hgb urine dipstick: NEGATIVE
Ketones, ur: NEGATIVE mg/dL
Leukocytes, UA: NEGATIVE
NITRITE: NEGATIVE
Protein, ur: NEGATIVE mg/dL
Specific Gravity, Urine: 1.025 (ref 1.005–1.030)
UROBILINOGEN UA: 1 mg/dL (ref 0.0–1.0)
pH: 7 (ref 5.0–8.0)

## 2016-05-25 MED ORDER — PHENAZOPYRIDINE HCL 200 MG PO TABS: 200.0000 mg | ORAL_TABLET | Freq: Three times a day (TID) | ORAL | 0 refills | Status: DC

## 2016-05-25 MED ORDER — CIPROFLOXACIN HCL 500 MG PO TABS: 500.0000 mg | ORAL_TABLET | Freq: Two times a day (BID) | ORAL | 0 refills | Status: DC

## 2016-05-25 NOTE — ED Triage Notes (Signed)
Pt here for urinary frequency, retention and dysuria.
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
## 2016-05-26 LAB — URINE CULTURE: Culture: <10000 — AB

## 2016-05-29 ENCOUNTER — Telehealth (HOSPITAL_COMMUNITY): Payer: Self-pay | Admitting: Emergency Medicine

## 2016-05-29 NOTE — Telephone Encounter (Signed)
Called and spoke w/mother Selinda Flavin(Gwendolyn Crump; caregiver) notified of recent lab results Reports sx still persist.  Adv pt if sx are not getting better to return or to f/u w/PCP Pt verb understanding.

## 2016-05-29 NOTE — Telephone Encounter (Signed)
-----   Message from Eustace MooreLaura W Murray, MD sent at 05/28/2016  4:32 PM EDT ----- Please let patient/caregiver know that urine culture does not clearly demonstrate a UTI.  Recheck or followup with PCP/Edwin Avbuere for further evaluation if symptoms persist.  LM

## 2016-08-30 ENCOUNTER — Ambulatory Visit (HOSPITAL_COMMUNITY)
Admission: EM | Admit: 2016-08-30 | Discharge: 2016-08-30 | Disposition: A | Discharge status: Home / Self Care | Payer: Medicaid Other | Attending: Emergency Medicine | Admitting: Emergency Medicine

## 2016-08-30 ENCOUNTER — Encounter (HOSPITAL_COMMUNITY): Payer: Self-pay | Admitting: Emergency Medicine

## 2016-08-30 DIAGNOSIS — L02214 Cutaneous abscess of groin: Secondary | ICD-10-CM

## 2016-08-30 MED ORDER — SULFAMETHOXAZOLE-TRIMETHOPRIM 200-40 MG/5ML PO SUSP: 20.0000 mL | Freq: Two times a day (BID) | ORAL | 0 refills | Status: AC

## 2016-08-30 NOTE — Discharge Instructions (Signed)
He has a small abscess. Give him Bactrim twice a day for 10 days. Give this with food. He can have Tylenol or ibuprofen as needed for pain. Apply warm compresses to the area at least 3 times a day. If this is not improving in 1 week, please follow-up here or with his primary care doctor as it may need to be lanced.

## 2016-08-30 NOTE — ED Triage Notes (Signed)
[facility] a [facility] pain x 3 days.

## 2016-08-30 NOTE — ED Provider Notes (Signed)
[facility]    CSN: [id number] Arrival date & time: 08/30/16  1116     History   Chief Complaint Chief Complaint  Patient presents with  . Groin Pain    HPI Bobby Huber is a 26 y.o. male.   HPI He is a 26 year old man here with his mom for evaluation of right groin pain. History is obtained from patient and mother as he does have developmental delay. He has complained of right groin pain for the last 3 days. It is constant. Mom states he will not let her examine the area. No difficulty urinating or complaints about pain with urination. No known injury or trauma. No fevers.  Past Medical History:  Diagnosis Date  . Difficulty swallowing pills   . Mass 04/2014   bilateral ear lobe  . Mental retardation   . Speech impediment    speech is not clear, difficult to understand    There are no active problems to display for this patient.   Past Surgical History:  Procedure Laterality Date  . MASS EXCISION Bilateral 04/20/2014   Procedure: EXCISION OF  MASSES BILATERAL POSTERIOR EAR LOBE WITH PLASTIC CLOSURE;  Surgeon: Louisa Second, MD;  Location: [facility];  Service: Plastics;  Laterality: Bilateral;  . MULTIPLE TOOTH EXTRACTIONS         Home Medications    Prior to Admission medications   Medication Sig Start Date End Date Taking? Authorizing Provider  sulfamethoxazole-trimethoprim (BACTRIM,SEPTRA) 200-40 MG/5ML suspension Take 20 mLs by mouth 2 (two) times daily. 08/30/16 09/09/16  Charm Rings, MD    Family History History reviewed. No pertinent family history.  Social History Social History  Substance Use Topics  . Smoking status: Never Smoker  . Smokeless tobacco: Never Used  . Alcohol use No     Allergies   Patient has no known allergies.   Review of Systems Review of Systems As in history of present illness  Physical Exam Triage Vital Signs ED Triage Vitals [08/30/16 1150]  Enc Vitals Group     BP 121/78     Pulse  Rate 77     Resp 18     Temp 97.7 F (36.5 C)     Temp Source Oral     SpO2 99 %     Weight      Height      Head Circumference      Peak Flow      Pain Score      Pain Loc      Pain Edu?      Excl. in GC?    No data found.   Updated Vital Signs BP 121/78 (BP Location: Right Arm)   Pulse 77   Temp 97.7 F (36.5 C) (Oral)   Resp 18   SpO2 99%   Visual Acuity Right Eye Distance:   Left Eye Distance:   Bilateral Distance:    Right Eye Near:   Left Eye Near:    Bilateral Near:     Physical Exam  Constitutional: He is oriented to person, place, and time. He appears well-developed and well-nourished. No distress.  Cardiovascular: Normal rate.   Pulmonary/Chest: Effort normal.  Genitourinary:     Genitourinary Comments: Difficult exam to perform as he is pushing my hand away.  Neurological: He is alert and oriented to person, place, and time.     UC Treatments / Results  Labs (all labs ordered are listed, but only abnormal results are displayed)  Labs Reviewed - No data to display  EKG  EKG Interpretation None       Radiology No results found.  Procedures Procedures (including critical care time)  Medications Ordered in UC Medications - No data to display   Initial Impression / Assessment and Plan / UC Course  I have reviewed the triage vital signs and the nursing notes.  Pertinent labs & imaging results that were available during my care of the patient were reviewed by me and considered in my medical decision making (see chart for details).     We'll attempt treatment with antibiotics and warm compresses. Bactrim for 10 days. If not improving, will need to follow-up for I&D.  Final Clinical Impressions(s) / UC Diagnoses   Final diagnoses:  Abscess of right groin    New Prescriptions New Prescriptions   SULFAMETHOXAZOLE-TRIMETHOPRIM (BACTRIM,SEPTRA) 200-40 MG/5ML SUSPENSION    Take 20 mLs by mouth 2 (two) times daily.     Charm RingsErin J Laurann Mcmorris,  MD 08/30/16 1212

## 2017-11-15 ENCOUNTER — Encounter (HOSPITAL_COMMUNITY): Payer: Self-pay | Admitting: Emergency Medicine

## 2017-11-15 ENCOUNTER — Ambulatory Visit (HOSPITAL_COMMUNITY)
Admission: EM | Admit: 2017-11-15 | Discharge: 2017-11-15 | Disposition: A | Discharge status: Home / Self Care | Payer: Medicaid Other | Attending: Internal Medicine | Admitting: Internal Medicine

## 2017-11-15 ENCOUNTER — Other Ambulatory Visit: Payer: Self-pay

## 2017-11-15 DIAGNOSIS — L0291 Cutaneous abscess, unspecified: Secondary | ICD-10-CM

## 2017-11-15 MED ORDER — DOXYCYCLINE HYCLATE 100 MG PO CAPS: 100.0000 mg | ORAL_CAPSULE | Freq: Two times a day (BID) | ORAL | 0 refills | Status: AC

## 2017-11-15 NOTE — Discharge Instructions (Signed)
Abscess does not appear to be large enough to drain at this time.  Please begin doxycycline twice daily for the next 10 days.  Please do frequent warm compresses or soaking and warm bath with massage to soften the skin.  If abscess continues to increase in size, pain or swelling please return in approximately 3 days for reevaluation.

## 2017-11-15 NOTE — ED Provider Notes (Signed)
[facility]    CSN: [id number] Arrival date & time: 11/15/17  1015     History   Chief Complaint Chief Complaint  Patient presents with  . Abscess    HPI Greogry Goodwyn is a 27 y.o. male presenting today for evaluation of abscess. Abscess has been present for 3 days and has had increasing swelling and tenderness. 2 months ago had similar swelling where it drained on its own.  Denies any drainage at this time.  Denies any fevers.  HPI  Past Medical History:  Diagnosis Date  . Difficulty swallowing pills   . Mass 04/2014   bilateral ear lobe  . Mental retardation   . Speech impediment    speech is not clear, difficult to understand    There are no active problems to display for this patient.   Past Surgical History:  Procedure Laterality Date  . MASS EXCISION Bilateral 04/20/2014   Procedure: EXCISION OF  MASSES BILATERAL POSTERIOR EAR LOBE WITH PLASTIC CLOSURE;  Surgeon: Louisa Second, MD;  Location: [facility];  Service: Plastics;  Laterality: Bilateral;  . MULTIPLE TOOTH EXTRACTIONS         Home Medications    Prior to Admission medications   Medication Sig Start Date End Date Taking? Authorizing Provider  doxycycline (VIBRAMYCIN) 100 MG capsule Take 1 capsule (100 mg total) by mouth 2 (two) times daily for 10 days. 11/15/17 11/25/17  Fusako Tanabe, Junius Creamer, PA-C    Family History History reviewed. No pertinent family history.  Social History Social History   Tobacco Use  . Smoking status: Never Smoker  . Smokeless tobacco: Never Used  Substance Use Topics  . Alcohol use: No  . Drug use: No     Allergies   Patient has no known allergies.   Review of Systems Review of Systems  Constitutional: Negative for fatigue and fever.  Respiratory: Negative for shortness of breath.   Cardiovascular: Negative for chest pain.  Gastrointestinal: Negative for abdominal pain, nausea and vomiting.  Genitourinary: Negative for dysuria,  scrotal swelling and testicular pain.  Musculoskeletal: Negative for arthralgias and myalgias.  Skin: Negative for color change, rash and wound.       Abscess  Neurological: Negative for weakness.     Physical Exam Triage Vital Signs ED Triage Vitals  Enc Vitals Group     BP 11/15/17 1057 105/69     Pulse Rate 11/15/17 1057 64     Resp --      Temp 11/15/17 1057 (!) 97.3 F (36.3 C)     Temp Source 11/15/17 1057 Temporal     SpO2 11/15/17 1057 99 %     Weight --      Height --      Head Circumference --      Peak Flow --      Pain Score 11/15/17 1056 8     Pain Loc --      Pain Edu? --      Excl. in GC? --    No data found.  Updated Vital Signs BP 105/69 (BP Location: Left Arm)   Pulse 64   Temp (!) 97.3 F (36.3 C) (Temporal)   SpO2 99%   Visual Acuity Right Eye Distance:   Left Eye Distance:   Bilateral Distance:    Right Eye Near:   Left Eye Near:    Bilateral Near:     Physical Exam  Constitutional: He appears well-developed and well-nourished.  HENT:  Head:  Normocephalic and atraumatic.  Eyes: Conjunctivae are normal.  Neck: Neck supple.  Cardiovascular: Normal rate and regular rhythm.  No murmur heard. Pulmonary/Chest: Effort normal and breath sounds normal. No respiratory distress.  Genitourinary:  Genitourinary Comments: Small 1 cm lesion to medial aspect of right thigh near groin.  Tenderness to palpation.  No fluctuance at this time.  Musculoskeletal: He exhibits no edema.  Neurological: He is alert.  Skin: Skin is warm and dry.  Psychiatric: He has a normal mood and affect.  Nursing note and vitals reviewed.    UC Treatments / Results  Labs (all labs ordered are listed, but only abnormal results are displayed) Labs Reviewed - No data to display  EKG None Radiology No results found.  Procedures Procedures (including critical care time)  Medications Ordered in UC Medications - No data to display   Initial Impression /  Assessment and Plan / UC Course  I have reviewed the triage vital signs and the nursing notes.  Pertinent labs & imaging results that were available during my care of the patient were reviewed by me and considered in my medical decision making (see chart for details).     Symptoms seem to be an early abscess, at this time does not appear to have required draining.  Will send home with doxycycline for 10 days, warm compresses and warm soaks.  Follow-up in 3-4 days this swelling and tenderness increasing. Discussed strict return precautions. Patient verbalized understanding and is agreeable with plan.   Final Clinical Impressions(s) / UC Diagnoses   Final diagnoses:  Abscess    ED Discharge Orders        Ordered    doxycycline (VIBRAMYCIN) 100 MG capsule  2 times daily     11/15/17 1140       Controlled Substance Prescriptions [place] Controlled Substance Registry consulted? Not Applicable   Lew DawesWieters, Morgana Rowley C, [place] JerseyPA-C 11/15/17 1144

## 2017-11-15 NOTE — ED Triage Notes (Signed)
Mom states pt has boil inside right thigh onset 3 days

## 2017-12-22 ENCOUNTER — Ambulatory Visit (HOSPITAL_COMMUNITY)
Admission: EM | Admit: 2017-12-22 | Discharge: 2017-12-22 | Disposition: A | Discharge status: Home / Self Care | Payer: Medicaid Other

## 2017-12-22 ENCOUNTER — Emergency Department (HOSPITAL_COMMUNITY)
Admission: EM | Admit: 2017-12-22 | Discharge: 2017-12-22 | Disposition: A | Discharge status: Home / Self Care | Payer: Medicaid Other | Attending: Emergency Medicine | Admitting: Emergency Medicine

## 2017-12-22 ENCOUNTER — Encounter (HOSPITAL_COMMUNITY): Payer: Self-pay | Admitting: Emergency Medicine

## 2017-12-22 ENCOUNTER — Ambulatory Visit (INDEPENDENT_AMBULATORY_CARE_PROVIDER_SITE_OTHER): Payer: Medicaid Other

## 2017-12-22 ENCOUNTER — Other Ambulatory Visit: Payer: Self-pay

## 2017-12-22 ENCOUNTER — Encounter (HOSPITAL_COMMUNITY): Payer: Self-pay

## 2017-12-22 DIAGNOSIS — R0789 Other chest pain: Secondary | ICD-10-CM | POA: Diagnosis not present

## 2017-12-22 DIAGNOSIS — R079 Chest pain, unspecified: Secondary | ICD-10-CM | POA: Insufficient documentation

## 2017-12-22 DIAGNOSIS — Z5321 Procedure and treatment not carried out due to patient leaving prior to being seen by health care provider: Secondary | ICD-10-CM | POA: Diagnosis not present

## 2017-12-22 LAB — I-STAT TROPONIN, ED: TROPONIN I, POC: 0 ng/mL (ref 0.00–0.08)

## 2017-12-22 LAB — CBC
HCT: 41.8 % (ref 39.0–52.0)
HEMOGLOBIN: 13.8 g/dL (ref 13.0–17.0)
MCH: 29.9 pg (ref 26.0–34.0)
MCHC: 33 g/dL (ref 30.0–36.0)
MCV: 90.5 fL (ref 78.0–100.0)
PLATELETS: 209 10*3/uL (ref 150–400)
RBC: 4.62 MIL/uL (ref 4.22–5.81)
RDW: 11.9 % (ref 11.5–15.5)
WBC: 6 10*3/uL (ref 4.0–10.5)

## 2017-12-22 LAB — BASIC METABOLIC PANEL
ANION GAP: 10 (ref 5–15)
BUN: 10 mg/dL (ref 6–20)
CALCIUM: 9.2 mg/dL (ref 8.9–10.3)
CO2: 24 mmol/L (ref 22–32)
Chloride: 104 mmol/L (ref 101–111)
Creatinine, Ser: 1.11 mg/dL (ref 0.61–1.24)
Glucose, Bld: 83 mg/dL (ref 65–99)
Potassium: 3.8 mmol/L (ref 3.5–5.1)
Sodium: 138 mmol/L (ref 135–145)

## 2017-12-22 LAB — POCT URINALYSIS DIP (DEVICE)
GLUCOSE, UA: 100 mg/dL — AB
Hgb urine dipstick: NEGATIVE
Leukocytes, UA: NEGATIVE
Nitrite: NEGATIVE
Protein, ur: NEGATIVE mg/dL
Specific Gravity, Urine: >=1.03 (ref 1.005–1.030)
Urobilinogen, UA: 2 mg/dL — ABNORMAL HIGH (ref 0.0–1.0)
pH: 5.5 (ref 5.0–8.0)

## 2017-12-22 NOTE — ED Notes (Signed)
Results reviewed, no changes in acuity at this time 

## 2017-12-22 NOTE — ED Provider Notes (Signed)
12/22/2017 1:02 PM   DOB: 1990-10-18 / MRN: [id number]  SUBJECTIVE:  Bobby Huber is a 27 y.o. male presenting for chest pain in the left upper chest.  Patient denies shortness of breath, diaphoresis, dizziness, nausea, leg swelling.  Patient has a history of pneumonia.  Has a history of MR and his mother is here with him today and delivers most of the history.  The family history of early cardiac death.  He is a never smoker  He has No Known Allergies.   He  has a past medical history of Difficulty swallowing pills, Mass (04/2014), Mental retardation, and Speech impediment.    He  reports that he has never smoked. He has never used smokeless tobacco. He reports that he does not drink alcohol or use drugs. He  has no sexual activity history on file. The patient  has a past surgical history that includes Multiple tooth extractions and Mass excision (Bilateral, 04/20/2014).  His family history is not on file.  ROS per HPI  OBJECTIVE:  BP 110/73 (BP Location: Left Arm)   Pulse 87   Temp 98.2 F (36.8 C) (Oral)   Resp 16   SpO2 96%   Physical Exam  Constitutional: He is oriented to person, place, and time. He appears well-developed. He does not appear ill.  Eyes: Pupils are equal, round, and reactive to light. Conjunctivae and EOM are normal.  Cardiovascular: Normal rate, regular rhythm, S1 normal, S2 normal, normal heart sounds, intact distal pulses and normal pulses. Exam reveals no gallop and no friction rub.  No murmur heard. Pulmonary/Chest: Effort normal and breath sounds normal. No stridor. No respiratory distress. He has no wheezes. He has no rales. He exhibits no tenderness.  Abdominal: He exhibits no distension.  Musculoskeletal: Normal range of motion. He exhibits no edema, tenderness or deformity.  Neurological: He is alert and oriented to person, place, and time. No cranial nerve deficit. Coordination normal.  Skin: Skin is warm and dry. He is not diaphoretic.  Psychiatric:  He has a normal mood and affect.  Nursing note and vitals reviewed.   Results for orders placed or performed during the hospital encounter of 12/22/17 (from the past 72 hour(s))  POCT urinalysis dip (device)     Status: Abnormal   Collection Time: 12/22/17 12:41 PM  Result Value Ref Range   Glucose, UA 100 (A) NEGATIVE mg/dL   Bilirubin Urine SMALL (A) NEGATIVE   Ketones, ur TRACE (A) NEGATIVE mg/dL   Specific Gravity, Urine >=1.030 1.005 - 1.030   Hgb urine dipstick NEGATIVE NEGATIVE   pH 5.5 5.0 - 8.0   Protein, ur NEGATIVE NEGATIVE mg/dL   Urobilinogen, UA 2.0 (H) 0.0 - 1.0 mg/dL   Nitrite NEGATIVE NEGATIVE   Leukocytes, UA NEGATIVE NEGATIVE    Comment: Biochemical Testing Only. Please order routine urinalysis from main lab if confirmatory testing is needed.    Dg Chest 2 View  Result Date: 12/22/2017 CLINICAL DATA:  27 year old male with a history chest pain EXAM: CHEST - 2 VIEW COMPARISON:  09/28/2012 FINDINGS: Cardiomediastinal silhouette unchanged in size and contour. Interval resolution of left-sided airspace opacity. No confluent airspace disease on the current. No pleural effusion or pneumothorax. No displaced fracture. Left apex curvature of the spine, potentially positional IMPRESSION: Negative for acute cardiopulmonary disease. Electronically Signed   By: Gilmer Mor D.O.   On: 12/22/2017 12:44    ASSESSMENT AND PLAN:  Orders Placed This Encounter  Procedures  . DG Chest 2 View  Standing Status:   Standing    Number of Occurrences:   1    Order Specific Question:   Reason for Exam (SYMPTOM  OR DIAGNOSIS REQUIRED)    Answer:   Left upper chest pain without an exertional component.  Marland Kitchen POCT urinalysis dip (device)    Standing Status:   Standing    Number of Occurrences:   1  . ED EKG    Standing Status:   Standing    Number of Occurrences:   1    Order Specific Question:   Reason for Exam    Answer:   Chest Pain     Atypical chest pain: EKG is within normal  limits.  He is a poor historian.  He has some abnormalities in his urine that appear to be nonspecific in relation to his chest pain and these abnormalities may represent an intra-abdominal pathology.  I feel confident he does not have pneumonia and also feel confident this is probably not a musculoskeletal etiology.  I have advised that they go to the emergency room for further work-up and rule out of a cardiac, pulmonary, abdominal etiology for this pain.      The patient is advised to call or return to clinic if he does not see an improvement in symptoms, or to seek the care of the closest emergency department if he worsens with the above plan.   Deliah Boston, MHS, PA-C 12/22/2017 1:02 PM    Ofilia Neas, PA-C 12/22/17 1312

## 2017-12-22 NOTE — ED Triage Notes (Signed)
Pt here with mother, c/o CP for a few hours mother states concern r/t similar complaints when pt had pneumonia. Pt has also been c/o burning with urination X3 days.

## 2017-12-22 NOTE — Discharge Instructions (Addendum)
Please proceed to the emergency department for further work-up.

## 2017-12-22 NOTE — ED Triage Notes (Signed)
Woke at 5 am today and complained of chest and head pain.  Denies uri symptoms.  For 2 days has complained of burning urination.    Had similar complaints when he had pneumonia.

## 2017-12-22 NOTE — ED Notes (Signed)
No answer from pt in waiting room 

## 2018-09-06 ENCOUNTER — Ambulatory Visit (HOSPITAL_COMMUNITY)
Admission: EM | Admit: 2018-09-06 | Discharge: 2018-09-06 | Disposition: A | Discharge status: Home / Self Care | Payer: Medicaid Other | Attending: Internal Medicine | Admitting: Internal Medicine

## 2018-09-06 ENCOUNTER — Ambulatory Visit (INDEPENDENT_AMBULATORY_CARE_PROVIDER_SITE_OTHER): Payer: Medicaid Other

## 2018-09-06 ENCOUNTER — Encounter (HOSPITAL_COMMUNITY): Payer: Self-pay

## 2018-09-06 DIAGNOSIS — R079 Chest pain, unspecified: Secondary | ICD-10-CM

## 2018-09-06 DIAGNOSIS — R69 Illness, unspecified: Secondary | ICD-10-CM | POA: Diagnosis not present

## 2018-09-06 DIAGNOSIS — J111 Influenza due to unidentified influenza virus with other respiratory manifestations: Secondary | ICD-10-CM

## 2018-09-06 MED ORDER — OSELTAMIVIR PHOSPHATE 75 MG PO CAPS: 75.0000 mg | ORAL_CAPSULE | Freq: Two times a day (BID) | ORAL | 0 refills | Status: DC

## 2018-09-06 MED ORDER — BENZONATATE 200 MG PO CAPS: 200.0000 mg | ORAL_CAPSULE | Freq: Three times a day (TID) | ORAL | 0 refills | Status: DC | PRN

## 2018-09-06 MED ORDER — ONDANSETRON HCL 4 MG PO TABS: 8.0000 mg | ORAL_TABLET | ORAL | 0 refills | Status: DC | PRN

## 2018-09-06 NOTE — ED Triage Notes (Signed)
Pt presents with central chest pain and vomiting.

## 2018-09-06 NOTE — ED Notes (Signed)
Per caregiver pt has had a cough for X 2 days and has a history of pneumonia.

## 2018-09-06 NOTE — ED Provider Notes (Signed)
[facility]    CSN: [id number] Arrival date & time: 09/06/18  1001     History   Chief Complaint Chief Complaint  Patient presents with  . Chest Pain    central chest pain  . Emesis    HPI Bobby Huber is a 28 y.o. male.   He presents today with fever and cough/posttussive emesis, started about 2 AM.  He did come home from his daily activities yesterday feeling under the weather.  No diarrhea.  Appetite this morning was poor.  Not much runny/congested nose, no sore throat, no abdominal pain.  HPI  Past Medical History:  Diagnosis Date  . Difficulty swallowing pills   . Mass 04/2014   bilateral ear lobe  . Mental retardation   . Speech impediment    speech is not clear, difficult to understand      Past Surgical History:  Procedure Laterality Date  . MASS EXCISION Bilateral 04/20/2014   Procedure: EXCISION OF  MASSES BILATERAL POSTERIOR EAR LOBE WITH PLASTIC CLOSURE;  Surgeon: Louisa SecondGerald Truesdale, MD;  Location: [facility];  Service: Plastics;  Laterality: Bilateral;  . MULTIPLE TOOTH EXTRACTIONS         Home Medications    Prior to Admission medications   Medication Sig Start Date End Date Taking? Authorizing Provider  benzonatate (TESSALON) 200 MG capsule Take 1 capsule (200 mg total) by mouth 3 (three) times daily as needed for cough. 09/06/18   Isa RankinMurray, Jaquarious Grey Wilson, MD  linaCLOtide (LINZESS PO) Take by mouth.    [provider]  ondansetron (ZOFRAN) 4 MG tablet Take 2 tablets (8 mg total) by mouth every 4 (four) hours as needed for nausea or vomiting. 09/06/18   Isa RankinMurray, Jamise Pentland Wilson, MD  oseltamivir (TAMIFLU) 75 MG capsule Take 1 capsule (75 mg total) by mouth every 12 (twelve) hours. 09/06/18   Isa RankinMurray, Prabhleen Montemayor Wilson, MD    Family History Family History  Problem Relation Age of Onset  . Healthy Mother     Social History Social History   Tobacco Use  . Smoking status: Never Smoker  . Smokeless tobacco: Never Used    Substance Use Topics  . Alcohol use: No  . Drug use: No     Allergies   Patient has no known allergies.   Review of Systems Review of Systems  All other systems reviewed and are negative.    Physical Exam Triage Vital Signs ED Triage Vitals  Enc Vitals Group     BP      Pulse      Resp      Temp      Temp src      SpO2      Weight      Height      Head Circumference      Peak Flow      Pain Score      Pain Loc      Pain Edu?      Excl. in GC?      Updated Vital Signs There were no vitals taken for this visit.  Physical Exam Vitals signs and nursing note reviewed.  Constitutional:      General: He is not in acute distress.    Comments: Alert, nicely groomed  HENT:     Head: Atraumatic.     Comments: Bilateral TMs are dull, no erythema, left ear is a little bit waxy Marked nasal congestion bilaterally Posterior pharynx is moderately injected Eyes:  Comments: Conjugate gaze, no eye redness/drainage  Neck:     Musculoskeletal: Neck supple.  Cardiovascular:     Rate and Rhythm: Normal rate and regular rhythm.  Pulmonary:     Effort: No respiratory distress.     Breath sounds: No wheezing or rales.     Comments: Lungs clear, symmetric breath sounds  Abdominal:     General: There is no distension.  Musculoskeletal: Normal range of motion.  Skin:    General: Skin is warm and dry.     Comments: No cyanosis  Neurological:     Mental Status: He is alert and oriented to person, place, and time.      UC Treatments / Results   Radiology CHEST - 2 VIEW  COMPARISON:  12/22/2017  FINDINGS: Normal heart size, mediastinal contours, and pulmonary vascularity.  Lungs clear.  No pleural effusion or pneumothorax.  Bones unremarkable.  IMPRESSION: Normal exam   Electronically Signed   By: Ulyses Southward M.D.   On: 09/06/2018 12:26   Final Clinical Impressions(s) / UC Diagnoses   Final diagnoses:  Influenza-like illness      Discharge Instructions     Chest xray today was negative for pneumonia.  Symptoms today seem most consistent with a flu like illness.  Prescriptions for oseltamivir (for flu), ondansetron (for vomiting/stomach upset), and for benzonatate (for cough) were sent to the pharmacy.  Anticipate gradual improvement in cough, vomiting after cough, and well being over the next several days.  Cough may take a couple weeks to subside.  Recheck for persistent (>3 more days) fever >100.5, increasing phlegm production/nasal discharge, or if not starting to improve in a few days.       ED Prescriptions    Medication Sig Dispense Auth. Provider   oseltamivir (TAMIFLU) 75 MG capsule Take 1 capsule (75 mg total) by mouth every 12 (twelve) hours. 10 capsule Isa Rankin, MD   benzonatate (TESSALON) 200 MG capsule Take 1 capsule (200 mg total) by mouth 3 (three) times daily as needed for cough. 30 capsule Isa Rankin, MD   ondansetron [facility]) 4 MG tablet Take 2 tablets (8 mg total) by mouth every 4 (four) hours as needed for nausea or vomiting. 20 tablet Isa Rankin, MD        Isa Rankin, MD 09/11/18 1302

## 2018-09-06 NOTE — Discharge Instructions (Addendum)
Chest xray today was negative for pneumonia.  Symptoms today seem most consistent with a flu like illness.  Prescriptions for oseltamivir (for flu), ondansetron (for vomiting/stomach upset), and for benzonatate (for cough) were sent to the pharmacy.  Anticipate gradual improvement in cough, vomiting after cough, and well being over the next several days.  Cough may take a couple weeks to subside.  Recheck for persistent (>3 more days) fever >100.5, increasing phlegm production/nasal discharge, or if not starting to improve in a few days.

## 2018-10-10 ENCOUNTER — Other Ambulatory Visit: Payer: Self-pay

## 2018-10-10 ENCOUNTER — Telehealth (HOSPITAL_COMMUNITY): Payer: Self-pay | Admitting: Emergency Medicine

## 2018-10-10 ENCOUNTER — Encounter (HOSPITAL_COMMUNITY): Payer: Self-pay | Admitting: Emergency Medicine

## 2018-10-10 ENCOUNTER — Ambulatory Visit (HOSPITAL_COMMUNITY)
Admission: EM | Admit: 2018-10-10 | Discharge: 2018-10-10 | Disposition: A | Discharge status: Home / Self Care | Payer: Medicaid Other | Attending: Internal Medicine | Admitting: Internal Medicine

## 2018-10-10 DIAGNOSIS — A084 Viral intestinal infection, unspecified: Secondary | ICD-10-CM | POA: Diagnosis not present

## 2018-10-10 MED ORDER — ONDANSETRON 4 MG PO TBDP
ORAL_TABLET | ORAL | Status: AC
  Filled 2018-10-10: qty 1

## 2018-10-10 MED ORDER — ONDANSETRON 4 MG PO TBDP: 4.0000 mg | ORAL_TABLET | Freq: Three times a day (TID) | ORAL | 0 refills | Status: DC | PRN

## 2018-10-10 MED ORDER — ONDANSETRON HCL 4 MG/2ML IJ SOLN
INTRAMUSCULAR | Status: AC
  Filled 2018-10-10: qty 2

## 2018-10-10 MED ORDER — ONDANSETRON HCL 4 MG/2ML IJ SOLN
4.0000 mg | Freq: Once | INTRAMUSCULAR | Status: AC
  Administered 2018-10-10: 4 mg via INTRAMUSCULAR

## 2018-10-10 MED ORDER — ONDANSETRON HCL 4 MG PO TABS: 4.0000 mg | ORAL_TABLET | Freq: Four times a day (QID) | ORAL | 0 refills | Status: DC

## 2018-10-10 NOTE — ED Triage Notes (Signed)
PT woke up this morning with vomiting, fever, and abdominal pain. PT has vomited 4 times today. No diarrhea.

## 2018-10-10 NOTE — ED Provider Notes (Signed)
[facility]    CSN: [id number] Arrival date & time: 10/10/18  1402     History   Chief Complaint Chief Complaint  Patient presents with  . Emesis  . Fever    HPI Bobby Huber is a 28 y.o. male with a history of developmental delay is brought to the urgent care by his mother on account of fever, abdominal pain and recurrent vomiting.   Patient symptoms started this morning.  Patient has had 4 episodes of nonbloody vomitus.  No diarrhea bowel movements.  Patient has developmental delay and hence history was provided by patient's mother.  HPI  Past Medical History:  Diagnosis Date  . Difficulty swallowing pills   . Mass 04/2014   bilateral ear lobe  . Mental retardation   . Speech impediment    speech is not clear, difficult to understand    There are no active problems to display for this patient.   Past Surgical History:  Procedure Laterality Date  . MASS EXCISION Bilateral 04/20/2014   Procedure: EXCISION OF  MASSES BILATERAL POSTERIOR EAR LOBE WITH PLASTIC CLOSURE;  Surgeon: Louisa Second, MD;  Location: [facility];  Service: Plastics;  Laterality: Bilateral;  . MULTIPLE TOOTH EXTRACTIONS         Home Medications    Prior to Admission medications   Medication Sig Start Date End Date Taking? Authorizing Provider  linaCLOtide (LINZESS PO) Take by mouth.   Yes [provider]  benzonatate (TESSALON) 200 MG capsule Take 1 capsule (200 mg total) by mouth 3 (three) times daily as needed for cough. 09/06/18   Isa Rankin, MD  ondansetron (ZOFRAN ODT) 4 MG disintegrating tablet Take 1 tablet (4 mg total) by mouth every 8 (eight) hours as needed for nausea or vomiting. 10/10/18   Addalie Calles, Britta Mccreedy, MD  ondansetron (ZOFRAN) 4 MG tablet Take 1 tablet (4 mg total) by mouth every 6 (six) hours. 10/10/18   LampteyBritta Mccreedy, MD    Family History Family History  Problem Relation Age of Onset  . Healthy Mother     Social  History Social History   Tobacco Use  . Smoking status: Never Smoker  . Smokeless tobacco: Never Used  Substance Use Topics  . Alcohol use: No  . Drug use: No     Allergies   Patient has no known allergies.   Review of Systems Review of Systems  Unable to perform ROS: Other  Patient has developmental delay   Physical Exam Triage Vital Signs ED Triage Vitals [10/10/18 1433]  Enc Vitals Group     BP 105/63     Pulse Rate (!) 118     Resp 16     Temp 99.4 F (37.4 C)     Temp Source Temporal     SpO2 98 %     Weight      Height      Head Circumference      Peak Flow      Pain Score      Pain Loc      Pain Edu?      Excl. in GC?    No data found.  Updated Vital Signs BP 105/63   Pulse (!) 118   Temp 99.4 F (37.4 C) (Temporal)   Resp 16   SpO2 98%   Visual Acuity Right Eye Distance:   Left Eye Distance:   Bilateral Distance:    Right Eye Near:   Left Eye Near:  Bilateral Near:     Physical Exam Constitutional:      Appearance: Normal appearance. He is not ill-appearing.  HENT:     Right Ear: Tympanic membrane normal.     Left Ear: Tympanic membrane normal.     Nose: Nose normal.     Mouth/Throat:     Mouth: Mucous membranes are moist.  Neck:     Musculoskeletal: Normal range of motion.  Cardiovascular:     Rate and Rhythm: Normal rate and regular rhythm.     Pulses: Normal pulses.     Heart sounds: Normal heart sounds.  Pulmonary:     Effort: Pulmonary effort is normal. No respiratory distress.     Breath sounds: No wheezing.  Abdominal:     General: Bowel sounds are normal.     Palpations: Abdomen is soft.  Musculoskeletal: Normal range of motion.  Skin:    Capillary Refill: Capillary refill takes less than 2 seconds.  Neurological:     General: No focal deficit present.     Mental Status: He is alert and oriented to person, place, and time.      UC Treatments / Results  Labs (all labs ordered are listed, but only abnormal  results are displayed) Labs Reviewed - No data to display  EKG None  Radiology No results found.  Procedures Procedures (including critical care time)  Medications Ordered in UC Medications  ondansetron (ZOFRAN) injection 4 mg (4 mg Intramuscular Given 10/10/18 1555)    Initial Impression / Assessment and Plan / UC Course  I have reviewed the triage vital signs and the nursing notes.  Pertinent labs & imaging results that were available during my care of the patient were reviewed by me and considered in my medical decision making (see chart for details).     1.  Acute viral gastroenteritis: Zofran as needed for vomiting Encourage electrolyte balanced oral fluid intake Patient would have benefited from IV fluids given his clinical dehydration and  tachycardia. Patient is more cooperative with that because of developmental delay.  2. Mental Retardation:Stable Final Clinical Impressions(s) / UC Diagnoses   Final diagnoses:  Viral gastroenteritis   Discharge Instructions   None    ED Prescriptions    Medication Sig Dispense Auth. Provider   ondansetron (ZOFRAN ODT) 4 MG disintegrating tablet Take 1 tablet (4 mg total) by mouth every 8 (eight) hours as needed for nausea or vomiting. 20 tablet Mckaila Duffus, Britta Mccreedy, MD     Controlled Substance Prescriptions [place] Controlled Substance Registry consulted? No   Merrilee Jansky, MD 10/11/18 [phone]

## 2018-10-12 ENCOUNTER — Ambulatory Visit (INDEPENDENT_AMBULATORY_CARE_PROVIDER_SITE_OTHER): Payer: Medicaid Other

## 2018-10-12 ENCOUNTER — Encounter (HOSPITAL_COMMUNITY): Payer: Self-pay

## 2018-10-12 ENCOUNTER — Ambulatory Visit (HOSPITAL_COMMUNITY)
Admission: EM | Admit: 2018-10-12 | Discharge: 2018-10-12 | Disposition: A | Discharge status: Home / Self Care | Payer: Medicaid Other | Attending: Radiology | Admitting: Radiology

## 2018-10-12 DIAGNOSIS — R0789 Other chest pain: Secondary | ICD-10-CM | POA: Diagnosis not present

## 2018-10-12 DIAGNOSIS — R079 Chest pain, unspecified: Secondary | ICD-10-CM | POA: Diagnosis not present

## 2018-10-12 DIAGNOSIS — R05 Cough: Secondary | ICD-10-CM | POA: Diagnosis not present

## 2018-10-12 MED ORDER — BENZONATATE 100 MG PO CAPS: 100.0000 mg | ORAL_CAPSULE | Freq: Three times a day (TID) | ORAL | 0 refills | Status: DC

## 2018-10-12 MED ORDER — IBUPROFEN 600 MG PO TABS: 600.0000 mg | ORAL_TABLET | Freq: Four times a day (QID) | ORAL | 0 refills | Status: DC | PRN

## 2018-10-12 NOTE — ED Provider Notes (Signed)
[facility]    CSN: [id number] Arrival date & time: 10/12/18  1322     History   Chief Complaint Chief Complaint  Patient presents with  . Chest Pain    right side     HPI Bobby Huber is a 28 y.o. male.   27 year old male history of developmental delay presents with left sided chest pain x1 day.  Mother at bedside providing history states patient was recently treated for upper respiratory illness (chart review she was viral gastritis) including productive cough.  Per mother patient is woken up several times middle the night reporting chest pain.  Mother reports that patient does not have a pain with palpation although patient responds affirmatively when asked.  Mother is concerned that patient has a pneumonia and is requesting testing at this time     Past Medical History:  Diagnosis Date  . Difficulty swallowing pills   . Mass 04/2014   bilateral ear lobe  . Mental retardation   . Speech impediment    speech is not clear, difficult to understand    There are no active problems to display for this patient.   Past Surgical History:  Procedure Laterality Date  . MASS EXCISION Bilateral 04/20/2014   Procedure: EXCISION OF  MASSES BILATERAL POSTERIOR EAR LOBE WITH PLASTIC CLOSURE;  Surgeon: Louisa Second, MD;  Location: [facility];  Service: Plastics;  Laterality: Bilateral;  . MULTIPLE TOOTH EXTRACTIONS         Home Medications    Prior to Admission medications   Medication Sig Start Date End Date Taking? Authorizing Provider  benzonatate (TESSALON) 200 MG capsule Take 1 capsule (200 mg total) by mouth 3 (three) times daily as needed for cough. 09/06/18   Isa Rankin, MD  linaCLOtide (LINZESS PO) Take by mouth.    [provider]  ondansetron (ZOFRAN ODT) 4 MG disintegrating tablet Take 1 tablet (4 mg total) by mouth every 8 (eight) hours as needed for nausea or vomiting. 10/10/18   Lamptey, Britta Mccreedy, MD  ondansetron  (ZOFRAN) 4 MG tablet Take 1 tablet (4 mg total) by mouth every 6 (six) hours. 10/10/18   LampteyBritta Mccreedy, MD    Family History Family History  Problem Relation Age of Onset  . Healthy Mother     Social History Social History   Tobacco Use  . Smoking status: Never Smoker  . Smokeless tobacco: Never Used  Substance Use Topics  . Alcohol use: No  . Drug use: No     Allergies   Patient has no known allergies.   Review of Systems Review of Systems   Physical Exam Triage Vital Signs ED Triage Vitals [10/12/18 1354]  Enc Vitals Group     BP 106/71     Pulse Rate 79     Resp 18     Temp 98.7 F (37.1 C)     Temp Source Skin     SpO2 99 %     Weight      Height      Head Circumference      Peak Flow      Pain Score      Pain Loc      Pain Edu?      Excl. in GC?    No data found.  Updated Vital Signs BP 106/71 (BP Location: Right Arm)   Pulse 79   Temp 98.7 F (37.1 C) (Skin)   Resp 18   SpO2 99%  Visual Acuity Right Eye Distance:   Left Eye Distance:   Bilateral Distance:    Right Eye Near:   Left Eye Near:    Bilateral Near:     Physical Exam   UC Treatments / Results  Labs (all labs ordered are listed, but only abnormal results are displayed) Labs Reviewed - No data to display  EKG None  Radiology No results found.  Procedures Procedures (including critical care time)  Medications Ordered in UC Medications - No data to display  Initial Impression / Assessment and Plan / UC Course  I have reviewed the triage vital signs and the nursing notes.  Pertinent labs & imaging results that were available during my care of the patient were reviewed by me and considered in my medical decision making (see chart for details).      Final Clinical Impressions(s) / UC Diagnoses   Final diagnoses:  None   Discharge Instructions   None    ED Prescriptions    None     Controlled Substance Prescriptions [place] Controlled Substance  Registry consulted? Not Applicable   Alene Mires, NP 10/12/18 1459

## 2018-10-12 NOTE — Discharge Instructions (Addendum)
Continue to push fluids and take over the counter medications as directed on the back of the box for symptomatic relief.  ° °

## 2018-10-12 NOTE — ED Triage Notes (Signed)
Pt C/o chest pain. Symptoms started on Thursday 10/10/18. Pt denies SOB

## 2019-07-02 ENCOUNTER — Encounter (HOSPITAL_COMMUNITY): Payer: Self-pay

## 2019-07-02 ENCOUNTER — Ambulatory Visit (HOSPITAL_COMMUNITY)
Admission: EM | Admit: 2019-07-02 | Discharge: 2019-07-02 | Disposition: A | Discharge status: Home / Self Care | Payer: Medicaid Other | Attending: Family Medicine | Admitting: Family Medicine

## 2019-07-02 ENCOUNTER — Other Ambulatory Visit: Payer: Self-pay

## 2019-07-02 DIAGNOSIS — N50812 Left testicular pain: Secondary | ICD-10-CM

## 2019-07-02 LAB — POCT URINALYSIS DIP (DEVICE)
Bilirubin Urine: NEGATIVE
Glucose, UA: NEGATIVE mg/dL
Hgb urine dipstick: NEGATIVE
Ketones, ur: NEGATIVE mg/dL
Leukocytes,Ua: NEGATIVE
Nitrite: NEGATIVE
Protein, ur: NEGATIVE mg/dL
Specific Gravity, Urine: 1.025 (ref 1.005–1.030)
Urobilinogen, UA: 0.2 mg/dL (ref 0.0–1.0)
pH: 7 (ref 5.0–8.0)

## 2019-07-02 MED ORDER — TRAMADOL HCL 50 MG PO TABS: 50.0000 mg | ORAL_TABLET | Freq: Four times a day (QID) | ORAL | 0 refills | Status: DC | PRN

## 2019-07-02 NOTE — ED Triage Notes (Signed)
Patient presents to Urgent Care with complaints of left testicle since about 9 days ago. Patient reports he was seen by his pcp and given cipro, which has not helped so the pcp sent the pt here for further eval.

## 2019-07-02 NOTE — Discharge Instructions (Addendum)
Be aware, pain medications may cause drowsiness. Please do not drive, operate heavy machinery or make important decisions while on this medication, it can cloud your judgement.  

## 2019-07-02 NOTE — ED Provider Notes (Signed)
[place]   [id number] 07/02/19 Arrival Time: 2585  ASSESSMENT & PLAN:  1. Testicular pain, left     Given duration of symptoms, low concern for torsion. Discussed with caregiver that he needs an U/S. She prefers to call PCP for outpatient order or contact urologist as below. If symptoms significantly worsen she will take him to the ED for evaluation.  To use sparingly: Meds ordered this encounter  Medications  . traMADol (ULTRAM) 50 MG tablet    Sig: Take 1 tablet (50 mg total) by mouth every 6 (six) hours as needed.    Dispense:  12 tablet    Refill:  0     Discharge Instructions     Be aware, pain medications may cause drowsiness. Please do not drive, operate heavy machinery or make important decisions while on this medication, it can cloud your judgement.    Follow-up Information    Schedule an appointment as soon as possible for a visit  with [place].   Contact information: [place].   Specialty: Emergency Medicine Why: If symptoms worsen in any way. Contact information: [address] [id number] [place] [phone]          Reviewed expectations re: course of current medical issues. Questions answered. Outlined signs and symptoms indicating need for more acute intervention. Patient verbalized understanding. After Visit Summary given.   SUBJECTIVE: History from: patient and caregiver. Bobby Huber is a 28 y.o. male who presents with complaint of scrotal/testcile discomfort. Gradual onset approx 1.5 weeks ago. Saw PCP; placed on Cipro; mother reports he has finished this; no change in his symptoms. Discomfort described as aching; without radiation; does not wake him at night. Symptoms are slightly worse since beginning. Fever: absent. Aggravating factors: have not been  identified. Alleviating factors: have not been identified. Associated symptoms: none reported. He denies constipation, diarrhea and dysuria. Appetite: normal. PO intake: normal. Ambulatory without assistance. Bowel movements: have not significantly changed. History of similar: no. Reports he is not sexually active; no penile discharge. OTC treatment: Ibuprofen 800mg  2-3x per day; mild help..   Past Surgical History:  Procedure Laterality Date  . MASS EXCISION Bilateral 04/20/2014   Procedure: EXCISION OF  MASSES BILATERAL POSTERIOR EAR LOBE WITH PLASTIC CLOSURE;  Surgeon: Cristine Polio, MD;  Location: [facility];  Service: Plastics;  Laterality: Bilateral;  . MULTIPLE TOOTH EXTRACTIONS      ROS: As per HPI. All other systems negative.  OBJECTIVE:  Vitals:   07/02/19 1011  BP: 99/73  Pulse: 71  Resp: 14  Temp: 98.2 F (36.8 C)  TempSrc: Oral  SpO2: 100%    General appearance: alert, oriented, no acute distress HEENT: [place]; AT; oropharynx moist Lungs: clear to auscultation bilaterally; unlabored respirations Heart: regular rate and rhythm Abdomen: soft; without distention; no specific tenderness to palpation; normal bowel sounds; without masses or organomegaly; without guarding or rebound tenderness GU: normal appearing genitalia; reports tenderness over L scrotum/testicle; mild swelling of L scrotum; no hernia appreciated Back: without CVA tenderness; FROM at waist Extremities: without LE edema; symmetrical; without gross deformities Skin: warm and dry Neurologic: normal gait Psychological: alert and cooperative; normal mood and affect  Labs: Results for orders placed or performed during the hospital encounter of 07/02/19  POCT urinalysis dip (device)  Result Value Ref Range   Glucose, UA NEGATIVE  NEGATIVE mg/dL   Bilirubin Urine NEGATIVE NEGATIVE   Ketones, ur NEGATIVE NEGATIVE mg/dL   Specific Gravity, Urine 1.025 1.005 - 1.030   Hgb urine dipstick  NEGATIVE NEGATIVE   pH 7.0 5.0 - 8.0   Protein, ur NEGATIVE NEGATIVE mg/dL   Urobilinogen, UA 0.2 0.0 - 1.0 mg/dL   Nitrite NEGATIVE NEGATIVE   Leukocytes,Ua NEGATIVE NEGATIVE   Labs Reviewed  POCT URINALYSIS DIP (DEVICE)    No Known Allergies                                             Past Medical History:  Diagnosis Date  . Difficulty swallowing pills   . Mass 04/2014   bilateral ear lobe  . Mental retardation   . Speech impediment    speech is not clear, difficult to understand   Social History   Socioeconomic History  . Marital status: Single    Spouse name: Not on file  . Number of children: Not on file  . Years of education: Not on file  . Highest education level: Not on file  Occupational History  . Not on file  Social Needs  . Financial resource strain: Not on file  . Food insecurity    Worry: Not on file    Inability: Not on file  . Transportation needs    Medical: Not on file    Non-medical: Not on file  Tobacco Use  . Smoking status: Passive Smoke Exposure - Never Smoker  . Smokeless tobacco: Never Used  . Tobacco comment: someone smokes outside  Substance and Sexual Activity  . Alcohol use: No  . Drug use: No  . Sexual activity: Not on file  Lifestyle  . Physical activity    Days per week: Not on file    Minutes per session: Not on file  . Stress: Not on file  Relationships  . Social Musician on phone: Not on file    Gets together: Not on file    Attends religious service: Not on file    Active member of club or organization: Not on file    Attends meetings of clubs or organizations: Not on file    Relationship status: Not on file  . Intimate partner violence    Fear of current or ex partner: Not on file    Emotionally abused: Not on file    Physically abused: Not on file    Forced sexual activity: Not on file  Other Topics Concern  . Not on file  Social History Narrative  . Not on file   Family History  Problem Relation  Age of Onset  . Healthy Mother      Mardella Layman, MD 07/02/19 1314

## 2019-07-03 ENCOUNTER — Encounter (HOSPITAL_COMMUNITY): Payer: Self-pay | Admitting: Obstetrics and Gynecology

## 2019-07-03 ENCOUNTER — Emergency Department (HOSPITAL_COMMUNITY): Payer: Medicaid Other

## 2019-07-03 ENCOUNTER — Emergency Department (HOSPITAL_COMMUNITY)
Admission: EM | Admit: 2019-07-03 | Discharge: 2019-07-03 | Disposition: A | Discharge status: Home / Self Care | Payer: Medicaid Other | Attending: Emergency Medicine | Admitting: Emergency Medicine

## 2019-07-03 ENCOUNTER — Other Ambulatory Visit: Payer: Self-pay

## 2019-07-03 DIAGNOSIS — N50812 Left testicular pain: Secondary | ICD-10-CM | POA: Insufficient documentation

## 2019-07-03 DIAGNOSIS — R1110 Vomiting, unspecified: Secondary | ICD-10-CM | POA: Insufficient documentation

## 2019-07-03 DIAGNOSIS — F79 Unspecified intellectual disabilities: Secondary | ICD-10-CM | POA: Diagnosis not present

## 2019-07-03 DIAGNOSIS — K5900 Constipation, unspecified: Secondary | ICD-10-CM | POA: Insufficient documentation

## 2019-07-03 DIAGNOSIS — N5089 Other specified disorders of the male genital organs: Secondary | ICD-10-CM | POA: Insufficient documentation

## 2019-07-03 DIAGNOSIS — Z7722 Contact with and (suspected) exposure to environmental tobacco smoke (acute) (chronic): Secondary | ICD-10-CM | POA: Insufficient documentation

## 2019-07-03 DIAGNOSIS — Z79899 Other long term (current) drug therapy: Secondary | ICD-10-CM | POA: Diagnosis not present

## 2019-07-03 MED ORDER — ONDANSETRON 4 MG PO TBDP
4.0000 mg | ORAL_TABLET | Freq: Once | ORAL | Status: AC
  Administered 2019-07-03: 4 mg via ORAL
  Filled 2019-07-03: qty 1

## 2019-07-03 MED ORDER — HYDROCODONE-ACETAMINOPHEN 5-325 MG PO TABS
1.0000 | ORAL_TABLET | Freq: Once | ORAL | Status: AC
  Administered 2019-07-03: 1 via ORAL
  Filled 2019-07-03: qty 1

## 2019-07-03 MED ORDER — HALOPERIDOL LACTATE 5 MG/ML IJ SOLN: 2.0000 mg | Freq: Once | INTRAMUSCULAR | Status: DC

## 2019-07-03 NOTE — ED Provider Notes (Signed)
[facility] DEPT Provider Note   CSN: [id number] Arrival date & time: 07/03/19  1426     History   Chief Complaint Chief Complaint  Patient presents with  . Groin Swelling  . Emesis    HPI Bobby Huber is a 28 y.o. male.     Patient is a 28 year old male with a history of MR but otherwise relatively healthy who lives with his mom and is being brought in today with persistent left testicular pain has now been ongoing for about 10 days but is gradually worsening.  Patient initially saw his PCP and was started on Cipro and completed a full course without significant improvement.  He was then seen yesterday at urgent care at which time they recommended an ultrasound and mom was planning on calling PCP and he was discharged home with tramadol.  However today mom states this afternoon the pain started worsening and has had 2 episodes of emesis which is very unlike him.  Also today he has not eaten as much is normal.  He had a urine done yesterday which was normal and mom has not noticed any penile drainage or change in bowel movements.  He always has constipation but last bowel movement was yesterday.  The vomiting did not seem to correlate to when he took the tramadol as it started 6 hours after he took tramadol.  The history is provided by a parent.  Emesis Severity:  Moderate Duration:  2 hours Timing:  Intermittent Number of daily episodes:  2 Quality:  Stomach contents Progression:  Unchanged Chronicity:  New Associated symptoms: no cough, no diarrhea, no sore throat and no URI   Associated symptoms comment:  Testicular pain for the last 10 days Risk factors comment:  MR with functional status of a 45-42 year old.  Never been sexually active.  Lives at home with mom   Past Medical History:  Diagnosis Date  . Difficulty swallowing pills   . Mass 04/2014   bilateral ear lobe  . Mental retardation   . Speech impediment    speech is not clear,  difficult to understand    There are no active problems to display for this patient.   Past Surgical History:  Procedure Laterality Date  . MASS EXCISION Bilateral 04/20/2014   Procedure: EXCISION OF  MASSES BILATERAL POSTERIOR EAR LOBE WITH PLASTIC CLOSURE;  Surgeon: Cristine Polio, MD;  Location: [facility];  Service: Plastics;  Laterality: Bilateral;  . MULTIPLE TOOTH EXTRACTIONS          Home Medications    Prior to Admission medications   Medication Sig Start Date End Date Taking? Authorizing Provider  benzonatate (TESSALON) 100 MG capsule Take 1 capsule (100 mg total) by mouth every 8 (eight) hours. 10/12/18   Jacqualine Mau, NP  benzonatate (TESSALON) 200 MG capsule Take 1 capsule (200 mg total) by mouth 3 (three) times daily as needed for cough. 09/06/18   Wynona Luna, MD  ciprofloxacin (CIPRO) 500 MG tablet SMARTSIG:1 Tablet(s) By Mouth Every 12 Hours 06/19/19   [provider]  ibuprofen (ADVIL,MOTRIN) 600 MG tablet Take 1 tablet (600 mg total) by mouth every 6 (six) hours as needed. 10/12/18   Jacqualine Mau, NP  linaCLOtide (LINZESS PO) Take by mouth.    [provider]  ondansetron (ZOFRAN ODT) 4 MG disintegrating tablet Take 1 tablet (4 mg total) by mouth every 8 (eight) hours as needed for nausea or vomiting. 10/10/18  Merrilee Jansky, MD  ondansetron (ZOFRAN) 4 MG tablet Take 1 tablet (4 mg total) by mouth every 6 (six) hours. 10/10/18   Lamptey, Britta Mccreedy, MD  traMADol (ULTRAM) 50 MG tablet Take 1 tablet (50 mg total) by mouth every 6 (six) hours as needed. 07/02/19   Mardella Layman, MD    Family History Family History  Problem Relation Age of Onset  . Healthy Mother     Social History Social History   Tobacco Use  . Smoking status: Passive Smoke Exposure - Never Smoker  . Smokeless tobacco: Never Used  . Tobacco comment: someone smokes outside  Substance Use Topics  . Alcohol use: No  . Drug use: No      Allergies   Patient has no known allergies.   Review of Systems Review of Systems  HENT: Negative for sore throat.   Respiratory: Negative for cough.   Gastrointestinal: Positive for vomiting. Negative for diarrhea.  All other systems reviewed and are negative.    Physical Exam Updated Vital Signs BP (!) 150/103 (BP Location: Left Arm)   Pulse (!) 53   Resp 16   SpO2 95%   Physical Exam Vitals signs and nursing note reviewed.  Constitutional:      General: He is not in acute distress.    Appearance: Normal appearance. He is well-developed and normal weight.  HENT:     Head: Normocephalic and atraumatic.  Eyes:     Conjunctiva/sclera: Conjunctivae normal.     Pupils: Pupils are equal, round, and reactive to light.  Neck:     Musculoskeletal: Normal range of motion and neck supple.  Cardiovascular:     Rate and Rhythm: Normal rate and regular rhythm.     Heart sounds: No murmur.  Pulmonary:     Effort: Pulmonary effort is normal. No respiratory distress.     Breath sounds: Normal breath sounds. No wheezing or rales.  Abdominal:     General: There is no distension.     Palpations: Abdomen is soft.     Tenderness: There is no abdominal tenderness. There is no guarding or rebound.  Genitourinary:    Penis: Normal and circumcised.      Scrotum/Testes:        Right: Mass, tenderness or swelling not present.        Left: Tenderness and swelling present. Mass not present.     Comments: Unable to do thorough exam and pt is grabbing my hand and trying to pull up his pants Musculoskeletal: Normal range of motion.        General: No tenderness.  Skin:    General: Skin is warm and dry.     Findings: No erythema or rash.  Neurological:     Mental Status: He is alert.     Comments: Awake and alert and somewhat cooperative  Psychiatric:     Comments: Somewhat cooperative but mildly agitated stating he wants to go home.      ED Treatments / Results  Labs (all labs  ordered are listed, but only abnormal results are displayed) Labs Reviewed - No data to display  EKG None  Radiology US Scrotum W/doppler  Result Date: 07/03/2019 CLINICAL DATA:  Left testicular pain EXAM: SCROTAL ULTRASOUND DOPPLER ULTRASOUND OF THE TESTICLES TECHNIQUE: Complete ultrasound examination of the testicles, epididymis, and other scrotal structures was performed. Color and spectral Doppler ultrasound were also utilized to evaluate blood flow to the testicles. COMPARISON:  None. FINDINGS: Right testicle Measurements: 4.1  x 2.0 x 2.8 cm. No mass or microlithiasis visualized. Left testicle Measurements: 3.2 x 1.9 x 3.2 cm. No mass or microlithiasis visualized. Right epididymis:  Normal in size and appearance. Left epididymis:  Normal in size and appearance. Hydrocele:  None visualized. Varicocele:  None visualized. Pulsed Doppler interrogation of both testes demonstrates normal low resistance arterial and venous waveforms bilaterally. IMPRESSION: No findings to explain the patient's symptoms. Electronically Signed   By: Romona Curlsyler  [place] M.D.   On: 07/03/2019 15:43    Procedures Procedures (including critical care time)  Medications Ordered in ED Medications  HYDROcodone-acetaminophen (NORCO/VICODIN) 5-325 MG per tablet 1 tablet (has no administration in time range)  ondansetron (ZOFRAN-ODT) disintegrating tablet 4 mg (has no administration in time range)     Initial Impression / Assessment and Plan / ED Course  I have reviewed the triage vital signs and the nursing notes.  Pertinent labs & imaging results that were available during my care of the patient were reviewed by me and considered in my medical decision making (see chart for details).        28 year old patient with MR presenting with 10 days of testicular pain that is worsening.  On exam he does have tenderness and mild swelling to the testicle.  Concern for mass versus torsion versus infection.  Patient has never been  sexually active he has a IQ of a 533-30-year-old and lives with his mom who gives the history.  Low suspicion for STI as his cause. We will start with an ultrasound and patient given pain control.  He has no abdominal pain at this time and is well-appearing.  4:15 PM US wnl without signs of testicular swelling or epididymal swelling.  Normal blood flow to both testicles.  At this time there is no explanation for the patient's pain but urine done yesterday was also normal and low suspicion for infection at this time.  Discussed the findings with mom who will continue to use pain control as needed and follow-up with PCP on Monday  Final Clinical Impressions(s) / ED Diagnoses   Final diagnoses:  Testicular pain, left    ED Discharge Orders    None       Gwyneth SproutPlunkett, Chanie Soucek, MD 07/03/19 1616
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
## 2019-07-03 NOTE — ED Notes (Signed)
Pt refused vitals. Mother verbalized D/C understanding of DC instructions. Pt ambulatory out of ED

## 2019-07-03 NOTE — ED Notes (Signed)
Provided pt warm blanket mother at bedside

## 2019-07-03 NOTE — ED Notes (Signed)
Pt refuses vitals  

## 2019-07-03 NOTE — ED Triage Notes (Signed)
Patient presents to ED with c/o left testicle pain and swelling. Patient was seen at urgent care yesterday for same and told to follow up with PCP and have an ultrasound done. Patient reportedly had episodes of emesis and his mother who is Legal Guardian became concerned and wanted him to have a ultrasound sooner.

## 2019-07-03 NOTE — ED Notes (Signed)
Plunkett MD at bedside. 

## 2019-07-05 ENCOUNTER — Other Ambulatory Visit (HOSPITAL_COMMUNITY): Payer: Self-pay

## 2020-03-18 ENCOUNTER — Ambulatory Visit (INDEPENDENT_AMBULATORY_CARE_PROVIDER_SITE_OTHER): Payer: Medicaid Other

## 2020-03-18 ENCOUNTER — Other Ambulatory Visit: Payer: Self-pay

## 2020-03-18 ENCOUNTER — Other Ambulatory Visit: Payer: Self-pay | Admitting: Sports Medicine

## 2020-03-18 ENCOUNTER — Ambulatory Visit: Payer: Medicaid Other | Admitting: Sports Medicine

## 2020-03-18 ENCOUNTER — Encounter: Payer: Self-pay | Admitting: Sports Medicine

## 2020-03-18 DIAGNOSIS — M216X1 Other acquired deformities of right foot: Secondary | ICD-10-CM | POA: Diagnosis not present

## 2020-03-18 DIAGNOSIS — M216X2 Other acquired deformities of left foot: Secondary | ICD-10-CM

## 2020-03-18 DIAGNOSIS — M79672 Pain in left foot: Secondary | ICD-10-CM

## 2020-03-18 DIAGNOSIS — Q6652 Congenital pes planus, left foot: Secondary | ICD-10-CM

## 2020-03-18 DIAGNOSIS — Q6650 Congenital pes planus, unspecified foot: Secondary | ICD-10-CM

## 2020-03-18 MED ORDER — PREDNISONE 10 MG (21) PO TBPK: ORAL_TABLET | ORAL | 0 refills | Status: DC

## 2020-03-18 NOTE — Progress Notes (Signed)
Subjective: Quintyn Dombek is a 29 y.o. male patient who presents to office for evaluation of left foot pain. Patient is assisted by mom who complains of progressive changes to feet over the years from flatfoot type with the last year being painful, saw another podiatrist who gave him a shot 6 months ago that helped for about 6 months. Mom's denies any other pedal complaints.   There are no problems to display for this patient.  Current Outpatient Medications on File Prior to Visit  Medication Sig Dispense Refill  . linaclotide (LINZESS) 145 MCG CAPS capsule Take 145 mcg by mouth daily before breakfast.    . ciprofloxacin (CIPRO) 500 MG tablet Take 500 mg by mouth 2 (two) times daily.  (Patient not taking: Reported on 03/18/2020)    . ibuprofen (ADVIL) 400 MG tablet Take 400 mg by mouth every 6 (six) hours as needed for headache or mild pain.  (Patient not taking: Reported on 03/18/2020)    . traMADol (ULTRAM) 50 MG tablet Take 1 tablet (50 mg total) by mouth every 6 (six) hours as needed. (Patient not taking: Reported on 03/18/2020) 12 tablet 0   No current facility-administered medications on file prior to visit.   No Known Allergies   Objective:  General: Alert and oriented x2 in no acute distress  Dermatology: No open lesions bilateral lower extremities, no webspace macerations, no ecchymosis bilateral, all nails x 10 are well manicured.  Vascular: Dorsalis Pedis and Posterior Tibial pedal pulses 2/4, Capillary Fill Time 3 seconds, (+) pedal hair growth bilateral, no edema bilateral lower extremities, Temperature gradient within normal limits.  Neurology: Michaell Cowing sensation intact via light touch bilateral.   Musculoskeletal: No reproducible tenderness to palpation bilateral, there is severe pes planus deformity with forefoot varus and hindfoot valgus, there is "too-many toes" sign appreciated, limited ankle joint range of motion with significant equinus deformity noted bilateral.  Xrays  Left foot:  Normal osseous mineralization. Joint spaces preserved. No fracture/dislocation/boney destruction. Mild 1st ray elevatus present. Increased Talar head uncovering present. Anterior break in cyma line with midtarsal breach present and signs of arthritis. Increased Talar declination present. Decreased calcaneal inclination present.  No soft tissue abnormalities or radiopaque foreign bodies.   Assessment and Plan: Problem List Items Addressed This Visit    None    Visit Diagnoses    Pain in left foot    -  Primary   Relevant Orders   DG Foot Complete Left   Congenital pes planus, unspecified laterality       Acquired equinus deformity of both feet           -Complete examination performed -Xrays reviewed -Discussed treatement options; discussed pes planus deformity and equinus;conservative and  surgical  -Rx Prednisone  -Rx Orthotics or AFO from [facility] -Recommend good supportive shoes -Recommend tylenol if needed for additional pain  -Patient to return to office 4-5 weeks or sooner if condition worsens.  Asencion Islam, DPM

## 2020-04-22 ENCOUNTER — Other Ambulatory Visit: Payer: Self-pay

## 2020-04-22 ENCOUNTER — Encounter: Payer: Self-pay | Admitting: Sports Medicine

## 2020-04-22 ENCOUNTER — Ambulatory Visit (INDEPENDENT_AMBULATORY_CARE_PROVIDER_SITE_OTHER): Payer: Medicaid Other | Admitting: Sports Medicine

## 2020-04-22 DIAGNOSIS — Q6650 Congenital pes planus, unspecified foot: Secondary | ICD-10-CM

## 2020-04-22 DIAGNOSIS — M216X1 Other acquired deformities of right foot: Secondary | ICD-10-CM

## 2020-04-22 DIAGNOSIS — M79672 Pain in left foot: Secondary | ICD-10-CM

## 2020-04-22 DIAGNOSIS — M216X2 Other acquired deformities of left foot: Secondary | ICD-10-CM

## 2020-04-22 NOTE — Progress Notes (Signed)
Subjective: Bobby Huber is a 29 y.o. male patient who presents to office for evaluation of left foot pain. Patient is assisted by mom who reports that he still has pain occasionally but has not gotten custom orthotics yet. Mom's denies any other pedal complaints.   There are no problems to display for this patient.  Current Outpatient Medications on File Prior to Visit  Medication Sig Dispense Refill  . ciprofloxacin (CIPRO) 500 MG tablet Take 500 mg by mouth 2 (two) times daily.  (Patient not taking: Reported on 03/18/2020)    . ibuprofen (ADVIL) 400 MG tablet Take 400 mg by mouth every 6 (six) hours as needed for headache or mild pain.  (Patient not taking: Reported on 03/18/2020)    . linaclotide (LINZESS) 145 MCG CAPS capsule Take 145 mcg by mouth daily before breakfast.    . predniSONE (STERAPRED UNI-PAK 21 TAB) 10 MG (21) TBPK tablet Take as directed 21 tablet 0  . traMADol (ULTRAM) 50 MG tablet Take 1 tablet (50 mg total) by mouth every 6 (six) hours as needed. (Patient not taking: Reported on 03/18/2020) 12 tablet 0   No current facility-administered medications on file prior to visit.   No Known Allergies   Objective:  General: Alert and oriented x2 in no acute distress  Dermatology: No open lesions bilateral lower extremities, no webspace macerations, no ecchymosis bilateral, all nails x 10 are well manicured.  Vascular: Dorsalis Pedis and Posterior Tibial pedal pulses 2/4, Capillary Fill Time 3 seconds, (+) pedal hair growth bilateral, no edema bilateral lower extremities, Temperature gradient within normal limits.  Neurology: Gross sensation intact via light touch bilateral.   Musculoskeletal: No reproducible tenderness to palpation bilateral, there is severe pes planus deformity with forefoot varus and hindfoot valgus, unchanged from prior, limited ankle joint range of motion with significant equinus deformity noted bilateral.  Assessment and Plan: Problem List Items  Addressed This Visit    None    Visit Diagnoses    Congenital pes planus, unspecified laterality    -  Primary   Acquired equinus deformity of both feet       Pain in left foot           -Complete examination performed -Re-Discussed treatement options; discussed pes planus deformity and equinus;conservative and  surgical  -Awaiting on Orthotics/ AFO from [facility] -Recommend good supportive shoes and stretching as instructed  -Recommend tylenol if needed for additional pain like before -Patient to return to office 6 weeks/once he has orthotics or sooner if condition worsens. Advised mom that if he is still having pain may consider PT.  Asencion Islam, DPM

## 2020-06-03 ENCOUNTER — Other Ambulatory Visit: Payer: Self-pay

## 2020-06-03 ENCOUNTER — Encounter: Payer: Self-pay | Admitting: Sports Medicine

## 2020-06-03 ENCOUNTER — Ambulatory Visit (INDEPENDENT_AMBULATORY_CARE_PROVIDER_SITE_OTHER): Payer: Medicaid Other | Admitting: Sports Medicine

## 2020-06-03 DIAGNOSIS — Q6650 Congenital pes planus, unspecified foot: Secondary | ICD-10-CM | POA: Diagnosis not present

## 2020-06-03 DIAGNOSIS — M216X1 Other acquired deformities of right foot: Secondary | ICD-10-CM | POA: Diagnosis not present

## 2020-06-03 DIAGNOSIS — M79672 Pain in left foot: Secondary | ICD-10-CM

## 2020-06-03 DIAGNOSIS — M216X2 Other acquired deformities of left foot: Secondary | ICD-10-CM

## 2020-06-03 NOTE — Progress Notes (Signed)
Subjective: Bobby Huber is a 29 y.o. male patient who presents to office for evaluation of left foot pain. Patient is assisted by mom who reports that he is doing good. He has not complained of pain and that orthotics are helping.  Patient has been using orthotics for about a month now.  No other pedal complaints noted.  There are no problems to display for this patient.  Current Outpatient Medications on File Prior to Visit  Medication Sig Dispense Refill  . ciprofloxacin (CIPRO) 500 MG tablet Take 500 mg by mouth 2 (two) times daily.  (Patient not taking: Reported on 03/18/2020)    . ibuprofen (ADVIL) 400 MG tablet Take 400 mg by mouth every 6 (six) hours as needed for headache or mild pain.  (Patient not taking: Reported on 03/18/2020)    . linaclotide (LINZESS) 145 MCG CAPS capsule Take 145 mcg by mouth daily before breakfast.    . predniSONE (STERAPRED UNI-PAK 21 TAB) 10 MG (21) TBPK tablet Take as directed 21 tablet 0  . traMADol (ULTRAM) 50 MG tablet Take 1 tablet (50 mg total) by mouth every 6 (six) hours as needed. (Patient not taking: Reported on 03/18/2020) 12 tablet 0   No current facility-administered medications on file prior to visit.   No Known Allergies   Objective:  General: Alert and oriented x2 in no acute distress  Dermatology: No open lesions bilateral lower extremities, no webspace macerations, no ecchymosis bilateral, all nails x 10 are well manicured.  Vascular: Dorsalis Pedis and Posterior Tibial pedal pulses 2/4, Capillary Fill Time 3 seconds, (+) pedal hair growth bilateral, no edema bilateral lower extremities, Temperature gradient within normal limits.  Neurology: Gross sensation intact via light touch bilateral.   Musculoskeletal: No reproducible tenderness to palpation bilateral, there is severe pes planus deformity with forefoot varus and hindfoot valgus, unchanged from prior, limited ankle joint range of motion with significant equinus deformity noted  bilateral.  Orthotics appear to be fitting well contouring foot and offering correction to deformity  Assessment and Plan: Problem List Items Addressed This Visit    None    Visit Diagnoses    Congenital pes planus, unspecified laterality    -  Primary   Acquired equinus deformity of both feet       Pain in left foot           -Complete examination performed -Orthotics evaluated -Continue with custom functional foot orthotics -Return to office as needed for new orthotic prescription every few years  Asencion Islam, DPM

## 2020-09-03 ENCOUNTER — Other Ambulatory Visit: Payer: Self-pay | Admitting: Otolaryngology

## 2020-09-28 IMAGING — US US SCROTUM W/ DOPPLER COMPLETE
1 series · 14 of 25 positions shown · non-contrast
Comparison: None.

CLINICAL DATA: Left testicular pain

EXAM:
SCROTAL ULTRASOUND
DOPPLER ULTRASOUND OF THE TESTICLES
TECHNIQUE: Complete ultrasound examination of the testicles, epididymis, and
other scrotal structures was performed. Color and spectral Doppler
ultrasound were also utilized to evaluate blood flow to the
testicles.

[Series 1: us scrotum w/ doppler complete · 37 acquisitions, 14 frames shown]
[im 1/37]
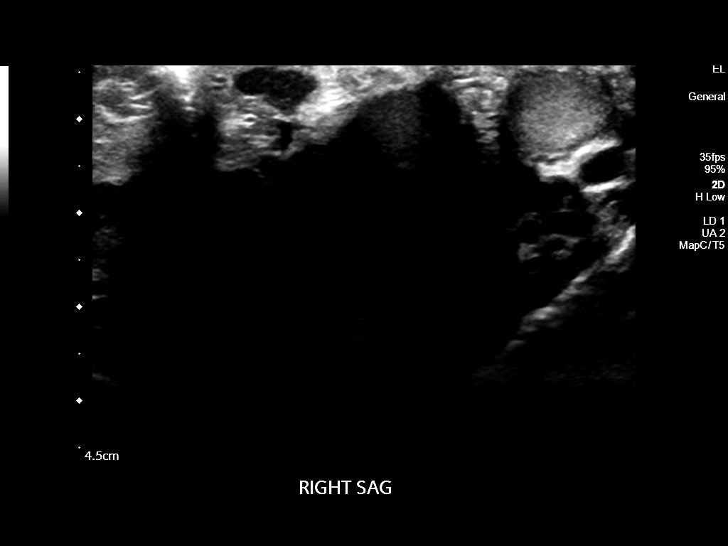
[im 4/37]
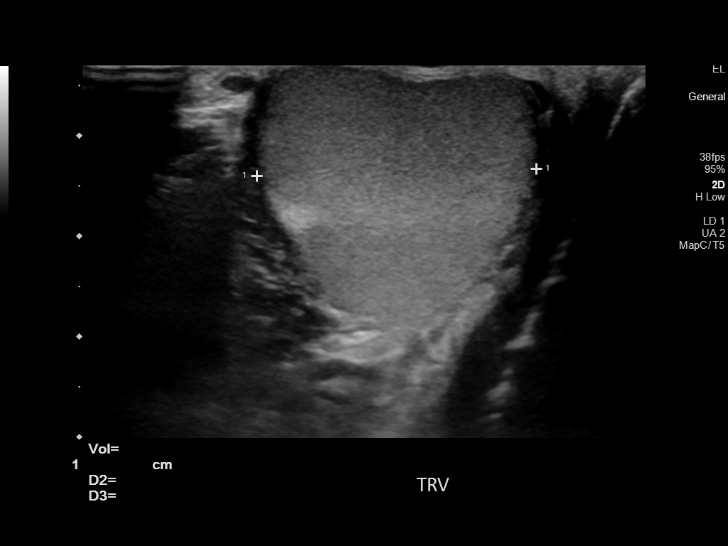
[im 7/37]
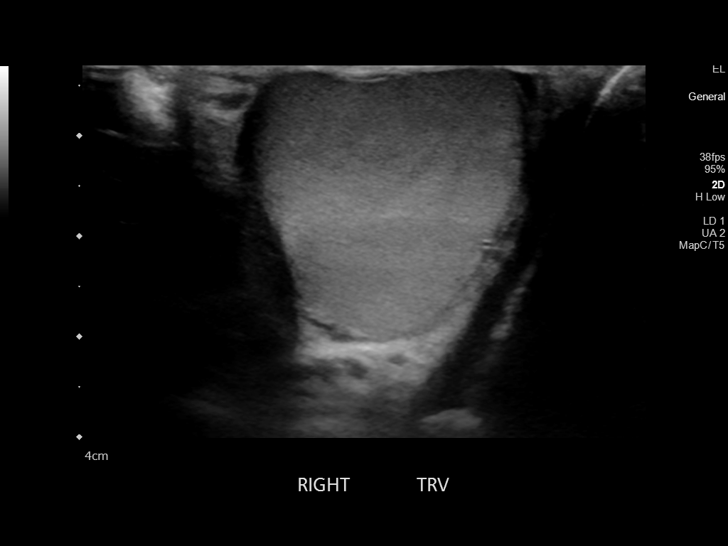
[im 10/37]
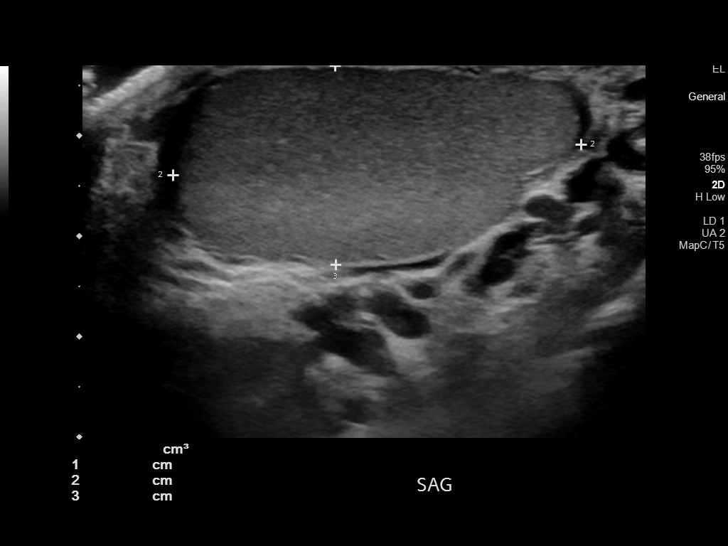
[im 13/37]
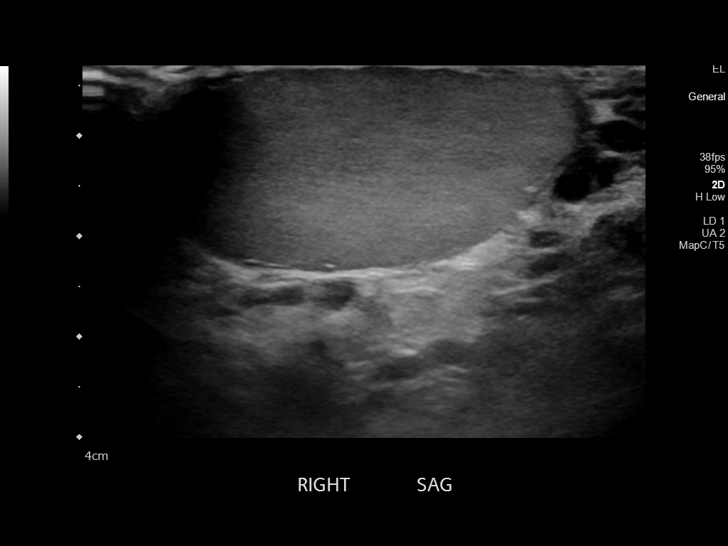
[im 14/37]
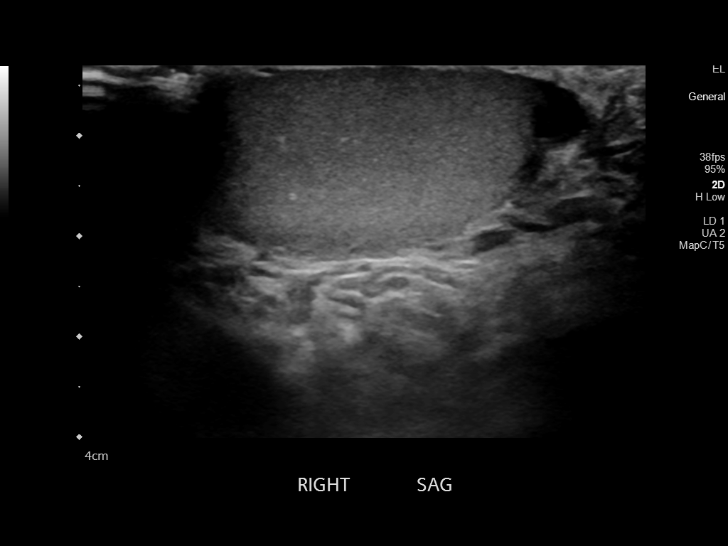
[im 17/37]
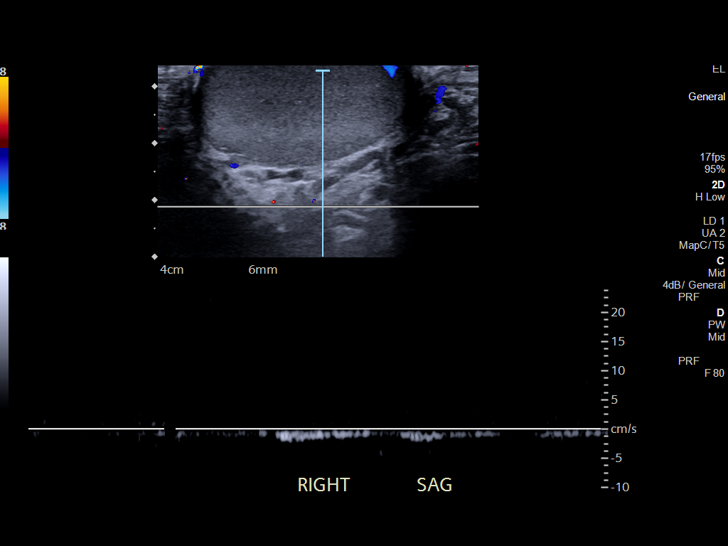
[im 20/37]
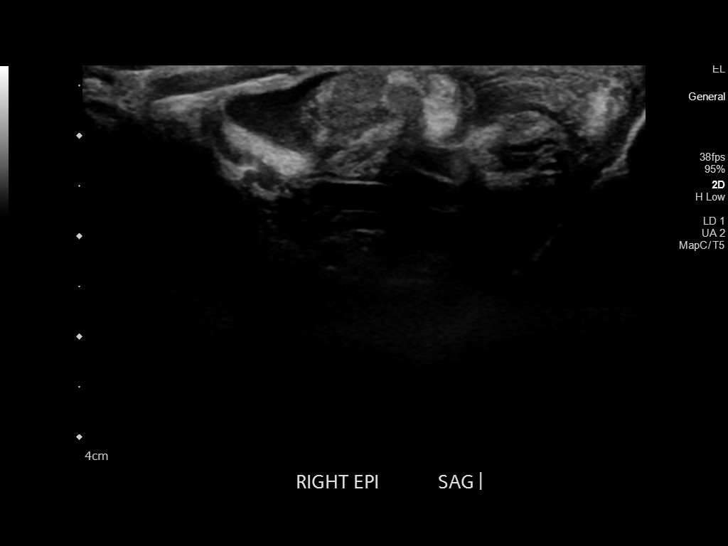
[im 23/37]
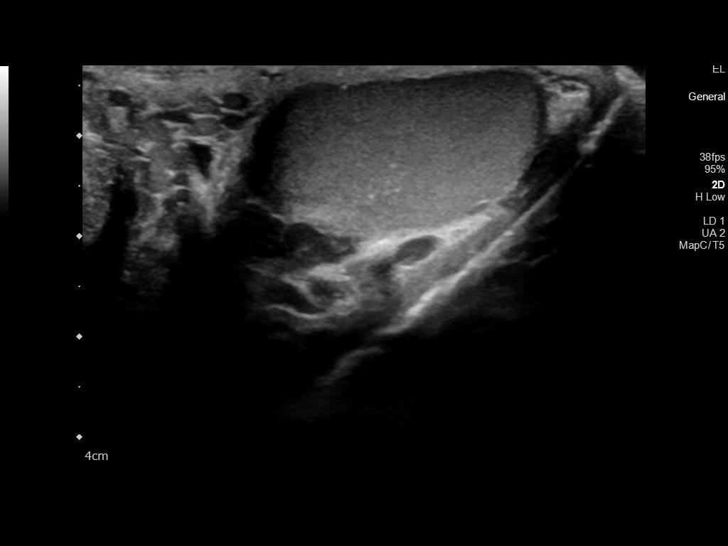
[im 25/37]
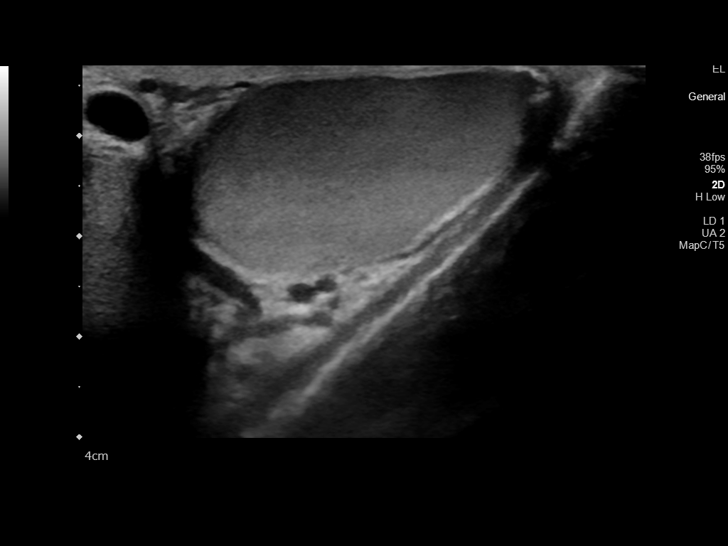
[im 28/37]
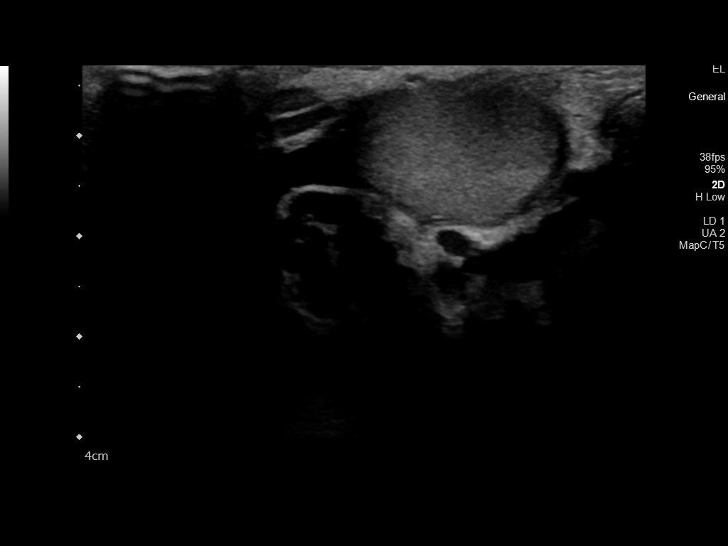
[im 31/37]
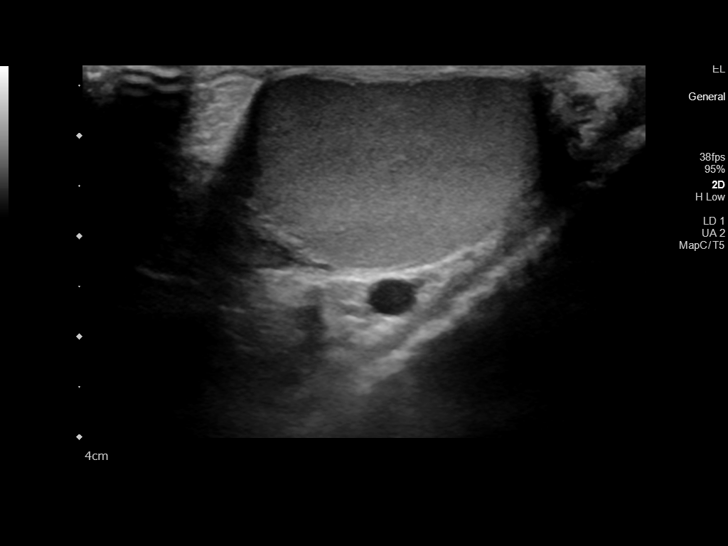
[im 34/37]
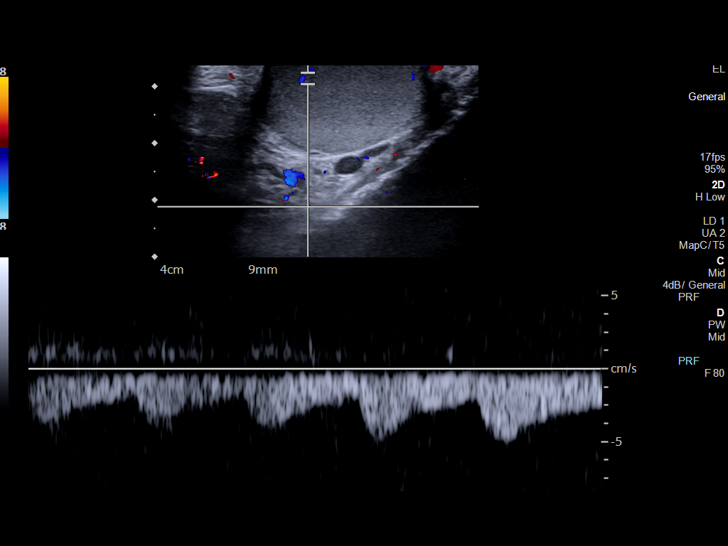
[im 37/37]
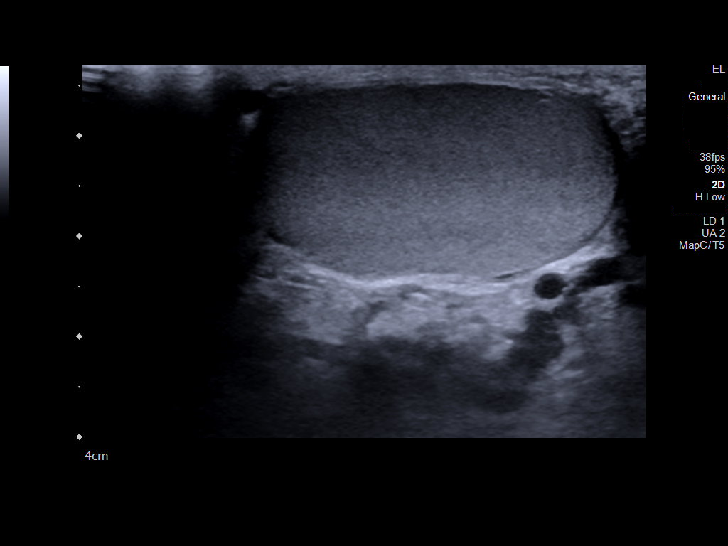

[14 of 25 positions shown; findings below may reference images not displayed]

FINDINGS: Right testicle

Measurements: 4.1 x 2.0 x 2.8 cm. No mass or microlithiasis
visualized.

Left testicle

Measurements: 3.2 x 1.9 x 3.2 cm. No mass or microlithiasis
visualized.

Right epididymis:  Normal in size and appearance.

Left epididymis:  Normal in size and appearance.

Hydrocele:  None visualized.

Varicocele:  None visualized.

Pulsed Doppler interrogation of both testes demonstrates normal low
resistance arterial and venous waveforms bilaterally.
IMPRESSION: No findings to explain the patient's symptoms.

## 2020-12-19 ENCOUNTER — Ambulatory Visit (HOSPITAL_COMMUNITY)
Admission: EM | Admit: 2020-12-19 | Discharge: 2020-12-19 | Disposition: A | Discharge status: Home / Self Care | Payer: Medicaid Other | Attending: Medical Oncology | Admitting: Medical Oncology

## 2020-12-19 ENCOUNTER — Encounter (HOSPITAL_COMMUNITY): Payer: Self-pay

## 2020-12-19 ENCOUNTER — Other Ambulatory Visit: Payer: Self-pay

## 2020-12-19 DIAGNOSIS — L089 Local infection of the skin and subcutaneous tissue, unspecified: Secondary | ICD-10-CM | POA: Diagnosis not present

## 2020-12-19 DIAGNOSIS — S00521A Blister (nonthermal) of lip, initial encounter: Secondary | ICD-10-CM

## 2020-12-19 MED ORDER — AMOXICILLIN-POT CLAVULANATE 875-125 MG PO TABS: 1.0000 | ORAL_TABLET | Freq: Two times a day (BID) | ORAL | 0 refills | Status: DC

## 2020-12-19 NOTE — ED Triage Notes (Signed)
Pt presents with lesion/infection on top left side of lip that caregiver noticed yesterday.

## 2020-12-19 NOTE — ED Provider Notes (Signed)
[facility]    CSN: [id number] Arrival date & time: 12/19/20  1400      History   Chief Complaint Chief Complaint  Patient presents with  . Mouth Lesions    HPI Bobby Huber is a 30 y.o. male.   HPI patient is brought in by his caregiver-caregiver gives history as patient is nonverbal.  They state that he had a dental procedure done to remove a posterior tooth on Monday.  Yesterday they noticed some redness's, some puffiness and a bit of discharge from his left upper lip.  Caregiver thinks that he may have accidentally bit his lip when it was numb and now has an infection.  Patient has a chronic dry mouth.  There is no trouble breathing or swallowing and no fevers.  Past Medical History:  Diagnosis Date  . Difficulty swallowing pills   . Mass 04/2014   bilateral ear lobe  . Mental retardation   . Speech impediment    speech is not clear, difficult to understand    There are no problems to display for this patient.   Past Surgical History:  Procedure Laterality Date  . MASS EXCISION Bilateral 04/20/2014   Procedure: EXCISION OF  MASSES BILATERAL POSTERIOR EAR LOBE WITH PLASTIC CLOSURE;  Surgeon: Louisa Second, MD;  Location: [facility];  Service: Plastics;  Laterality: Bilateral;  . MULTIPLE TOOTH EXTRACTIONS         Home Medications    Prior to Admission medications   Medication Sig Start Date End Date Taking? Authorizing Provider  ciprofloxacin (CIPRO) 500 MG tablet Take 500 mg by mouth 2 (two) times daily.  06/19/19   [provider]  ibuprofen (ADVIL) 400 MG tablet Take 400 mg by mouth every 6 (six) hours as needed for headache or mild pain.  06/19/19   [provider]  linaclotide (LINZESS) 145 MCG CAPS capsule Take 145 mcg by mouth daily before breakfast.    [provider]  predniSONE (STERAPRED UNI-PAK 21 TAB) 10 MG (21) TBPK tablet Take as directed 03/18/20   Asencion Islam, DPM  traMADol (ULTRAM) 50  MG tablet Take 1 tablet (50 mg total) by mouth every 6 (six) hours as needed. 07/02/19   Mardella Layman, MD    Family History Family History  Problem Relation Age of Onset  . Healthy Mother     Social History Social History   Tobacco Use  . Smoking status: Passive Smoke Exposure - Never Smoker  . Smokeless tobacco: Never Used  . Tobacco comment: someone smokes outside  Vaping Use  . Vaping Use: Never used  Substance Use Topics  . Alcohol use: No  . Drug use: No     Allergies   Patient has no known allergies.   Review of Systems Review of Systems  As stated above in HPI Physical Exam Triage Vital Signs ED Triage Vitals [12/19/20 1508]  Enc Vitals Group     BP 116/80     Pulse Rate 67     Resp 18     Temp      Temp src      SpO2 95 %     Weight      Height      Head Circumference      Peak Flow      Pain Score      Pain Loc      Pain Edu?      Excl. in GC?    No data found.  Updated Vital Signs BP 116/80 (BP Location: Right Arm)   Pulse 67   Resp 18   SpO2 95%   Physical Exam Vitals and nursing note reviewed.  Constitutional:      General: He is not in acute distress.    Appearance: Normal appearance. He is not ill-appearing, toxic-appearing or diaphoretic.  HENT:     Head: Normocephalic and atraumatic.     Nose: Nose normal.     Mouth/Throat:     Mouth: Mucous membranes are dry.     Comments: There is scant swelling of the left upper lip with ulcer formation, erythema and scant white discharge  Cardiovascular:     Rate and Rhythm: Normal rate and regular rhythm.     Heart sounds: Normal heart sounds.  Pulmonary:     Effort: Pulmonary effort is normal.     Breath sounds: Normal breath sounds.  Lymphadenopathy:     Cervical: No cervical adenopathy.  Neurological:     Mental Status: He is alert.      UC Treatments / Results  Labs (all labs ordered are listed, but only abnormal results are displayed) Labs Reviewed - No data to  display  EKG   Radiology No results found.  Procedures Procedures (including critical care time)  Medications Ordered in UC Medications - No data to display  Initial Impression / Assessment and Plan / UC Course  I have reviewed the triage vital signs and the nursing notes.  Pertinent labs & imaging results that were available during my care of the patient were reviewed by me and considered in my medical decision making (see chart for details).     New.  Treating for this infection that likely occurred from his chronically dry lips and from recent dental procedure where his mouth would have been retracted for an extended period of time which increases the likelihood of a infection.  Treating with Augmentin.  Discussed red flag signs and symptoms.  I asked and verified with caregiver regarding his history of being able to swallow antibiotic pills.  She states that he now is able to swallow pills successfully Final Clinical Impressions(s) / UC Diagnoses   Final diagnoses:  None   Discharge Instructions   None    ED Prescriptions    None     PDMP not reviewed this encounter.   Rushie Chestnut, [place] 12/19/20 1537

## 2021-01-14 ENCOUNTER — Ambulatory Visit (HOSPITAL_COMMUNITY)
Admission: EM | Admit: 2021-01-14 | Discharge: 2021-01-14 | Disposition: A | Discharge status: Home / Self Care | Payer: Medicaid Other | Attending: Physician Assistant | Admitting: Physician Assistant

## 2021-01-14 ENCOUNTER — Other Ambulatory Visit: Payer: Self-pay

## 2021-01-14 ENCOUNTER — Encounter (HOSPITAL_COMMUNITY): Payer: Self-pay

## 2021-01-14 DIAGNOSIS — L03211 Cellulitis of face: Secondary | ICD-10-CM

## 2021-01-14 DIAGNOSIS — R220 Localized swelling, mass and lump, head: Secondary | ICD-10-CM | POA: Diagnosis not present

## 2021-01-14 MED ORDER — DOXYCYCLINE HYCLATE 100 MG PO CAPS: 100.0000 mg | ORAL_CAPSULE | Freq: Two times a day (BID) | ORAL | 0 refills | Status: DC

## 2021-01-14 NOTE — ED Triage Notes (Addendum)
Per pt mother, pt with bump on face that started last week. Swollen area noted to right side of cheek. Per mom area was draining yellow pus. Area now draining small amount of serosanguinous fluid. Pt has had tylenol for symptoms.

## 2021-01-14 NOTE — ED Provider Notes (Signed)
[facility]    CSN: [id number] Arrival date & time: 01/14/21  1141      History   Chief Complaint Chief Complaint  Patient presents with   Facial Swelling    HPI Bobby Huber is a 30 y.o. male.   The history is provided by the patient and a caregiver. No language interpreter was used.  Abscess Location:  Face Facial abscess location:  Face and R cheek Abscess quality: draining and warmth   Chronicity:  New Relieved by:  Nothing Worsened by:  Nothing Ineffective treatments:  None tried Pt has swelling right face.  Pt had drainage from area   Past Medical History:  Diagnosis Date   Difficulty swallowing pills    Mass 04/2014   bilateral ear lobe   Mental retardation    Speech impediment    speech is not clear, difficult to understand    There are no problems to display for this patient.   Past Surgical History:  Procedure Laterality Date   MASS EXCISION Bilateral 04/20/2014   Procedure: EXCISION OF  MASSES BILATERAL POSTERIOR EAR LOBE WITH PLASTIC CLOSURE;  Surgeon: Louisa Second, MD;  Location: [facility];  Service: Plastics;  Laterality: Bilateral;   MULTIPLE TOOTH EXTRACTIONS         Home Medications    Prior to Admission medications   Medication Sig Start Date End Date Taking? Authorizing Provider  doxycycline (VIBRAMYCIN) 100 MG capsule Take 1 capsule (100 mg total) by mouth 2 (two) times daily. 01/14/21  Yes Cheron Schaumann K, PA-C  linaclotide (LINZESS) 145 MCG CAPS capsule Take 145 mcg by mouth daily before breakfast.   Yes [provider]    Family History Family History  Problem Relation Age of Onset   Healthy Mother    Hypertension Other    Heart attack Other    Heart attack Other    Hypertension Other    Cancer Maternal Grandmother    Cancer Maternal Aunt    Cancer Maternal Uncle    Cancer Paternal Aunt    Cancer Cousin    Cancer Cousin    Hypertension Maternal Grandfather     Social  History Social History   Tobacco Use   Smoking status: Never    Passive exposure: Yes   Smokeless tobacco: Never   Tobacco comments:    someone smokes outside  Vaping Use   Vaping Use: Never used  Substance Use Topics   Alcohol use: No   Drug use: No     Allergies   Patient has no known allergies.   Review of Systems Review of Systems  All other systems reviewed and are negative.   Physical Exam Triage Vital Signs ED Triage Vitals  Enc Vitals Group     BP 01/14/21 1218 104/75     Pulse Rate 01/14/21 1218 66     Resp 01/14/21 1218 18     Temp 01/14/21 1218 97.7 F (36.5 C)     Temp src --      SpO2 01/14/21 1218 98 %     Weight --      Height --      Head Circumference --      Peak Flow --      Pain Score 01/14/21 1213 5     Pain Loc --      Pain Edu? --      Excl. in GC? --    No data found.  Updated Vital Signs BP 104/75  Pulse 66   Temp 97.7 F (36.5 C)   Resp 18   SpO2 98%   Visual Acuity Right Eye Distance:   Left Eye Distance:   Bilateral Distance:    Right Eye Near:   Left Eye Near:    Bilateral Near:     Physical Exam Vitals reviewed.  HENT:     Head: Normocephalic.  Cardiovascular:     Rate and Rhythm: Normal rate.  Pulmonary:     Effort: Pulmonary effort is normal.  Skin:    Findings: Erythema present.     Comments: Swollen area right cheek.  Warm to touch   Neurological:     General: No focal deficit present.     Mental Status: He is alert.  Psychiatric:        Mood and Affect: Mood normal.     UC Treatments / Results  Labs (all labs ordered are listed, but only abnormal results are displayed) Labs Reviewed - No data to display  EKG   Radiology No results found.  Procedures Procedures (including critical care time)  Medications Ordered in UC Medications - No data to display  Initial Impression / Assessment and Plan / UC Course  I have reviewed the triage vital signs and the nursing notes.  Pertinent  labs & imaging results that were available during my care of the patient were reviewed by me and considered in my medical decision making (see chart for details).     Final Clinical Impressions(s) / UC Diagnoses   Final diagnoses:  Right facial swelling  Facial cellulitis     Discharge Instructions      Warm compresses 20 minutes 4 times a day   ED Prescriptions     Medication Sig Dispense Auth. Provider   doxycycline (VIBRAMYCIN) 100 MG capsule Take 1 capsule (100 mg total) by mouth 2 (two) times daily. 20 capsule Elson Areas, [place]      PDMP not reviewed this encounter.   Elson Areas, [place] 01/14/21 1229

## 2021-01-14 NOTE — Discharge Instructions (Addendum)
Warm compresses 20 minutes 4 times a day  

## 2021-10-13 ENCOUNTER — Other Ambulatory Visit: Payer: Self-pay | Admitting: Internal Medicine

## 2021-10-14 LAB — COMPLETE METABOLIC PANEL WITH GFR
AG Ratio: 1.3 (calc) (ref 1.0–2.5)
ALT: 19 U/L (ref 9–46)
AST: 19 U/L (ref 10–40)
Albumin: 4.6 g/dL (ref 3.6–5.1)
Alkaline phosphatase (APISO): 93 U/L (ref 36–130)
BUN: 14 mg/dL (ref 7–25)
CO2: 23 mmol/L (ref 20–32)
Calcium: 9.3 mg/dL (ref 8.6–10.3)
Chloride: 106 mmol/L (ref 98–110)
Creat: 1.14 mg/dL (ref 0.60–1.26)
Globulin: 3.6 g/dL (calc) (ref 1.9–3.7)
Glucose, Bld: 91 mg/dL (ref 65–99)
Potassium: 3.8 mmol/L (ref 3.5–5.3)
Sodium: 143 mmol/L (ref 135–146)
Total Bilirubin: 0.6 mg/dL (ref 0.2–1.2)
Total Protein: 8.2 g/dL — ABNORMAL HIGH (ref 6.1–8.1)
eGFR: 87 mL/min/{1.73_m2} (ref >=60)

## 2021-10-14 LAB — LIPID PANEL
Cholesterol: 195 mg/dL (ref <200)
HDL: 36 mg/dL — ABNORMAL LOW (ref >=40)
Non-HDL Cholesterol (Calc): 159 mg/dL (calc) — ABNORMAL HIGH (ref <130)
Total CHOL/HDL Ratio: 5.4 (calc) — ABNORMAL HIGH (ref <5.0)
Triglycerides: 437 mg/dL — ABNORMAL HIGH (ref <150)

## 2021-10-14 LAB — CBC
HCT: 42.5 % (ref 38.5–50.0)
Hemoglobin: 14.6 g/dL (ref 13.2–17.1)
MCH: 30.7 pg (ref 27.0–33.0)
MCHC: 34.4 g/dL (ref 32.0–36.0)
MCV: 89.3 fL (ref 80.0–100.0)
MPV: 11.2 fL (ref 7.5–12.5)
Platelets: 247 10*3/uL (ref 140–400)
RBC: 4.76 10*6/uL (ref 4.20–5.80)
RDW: 12.2 % (ref 11.0–15.0)
WBC: 6.7 10*3/uL (ref 3.8–10.8)

## 2021-10-14 LAB — VITAMIN D 25 HYDROXY (VIT D DEFICIENCY, FRACTURES): Vit D, 25-Hydroxy: 24 ng/mL — ABNORMAL LOW (ref 30–100)

## 2021-10-14 LAB — TSH: TSH: 2.58 mIU/L (ref 0.40–4.50)

## 2021-12-29 ENCOUNTER — Ambulatory Visit (INDEPENDENT_AMBULATORY_CARE_PROVIDER_SITE_OTHER): Payer: Medicaid Other | Admitting: Sports Medicine

## 2021-12-29 ENCOUNTER — Ambulatory Visit (INDEPENDENT_AMBULATORY_CARE_PROVIDER_SITE_OTHER): Payer: Medicaid Other

## 2021-12-29 DIAGNOSIS — B359 Dermatophytosis, unspecified: Secondary | ICD-10-CM

## 2021-12-29 DIAGNOSIS — M216X1 Other acquired deformities of right foot: Secondary | ICD-10-CM | POA: Diagnosis not present

## 2021-12-29 DIAGNOSIS — M79672 Pain in left foot: Secondary | ICD-10-CM | POA: Diagnosis not present

## 2021-12-29 DIAGNOSIS — M779 Enthesopathy, unspecified: Secondary | ICD-10-CM | POA: Diagnosis not present

## 2021-12-29 DIAGNOSIS — Q6650 Congenital pes planus, unspecified foot: Secondary | ICD-10-CM

## 2021-12-29 DIAGNOSIS — M216X2 Other acquired deformities of left foot: Secondary | ICD-10-CM

## 2021-12-29 MED ORDER — DICLOFENAC EPOLAMINE 1.3 % EX PTCH: 1.0000 | MEDICATED_PATCH | Freq: Two times a day (BID) | CUTANEOUS | 1 refills | Status: AC

## 2021-12-29 MED ORDER — NYSTATIN-TRIAMCINOLONE 100000-0.1 UNIT/GM-% EX OINT: 1.0000 "application " | TOPICAL_OINTMENT | Freq: Every day | CUTANEOUS | 0 refills | Status: AC

## 2021-12-29 NOTE — Progress Notes (Signed)
Subjective: Bobby Huber is a 31 y.o. male patient who presents to office for evaluation of left foot pain. Patient is assisted by mom who reports that he has been having a flareup in pain over the last 5 months he has been seeing an outside podiatrist who has been given injections and has already had 2 injections but they seem to not help much.  Reports that patient at the end of the day has a lot of swelling and some discoloration at the medial foot and ankle.  Denies any other pedal complaints at this time.  There are no problems to display for this patient.  Current Outpatient Medications on File Prior to Visit  Medication Sig Dispense Refill   doxycycline (VIBRA-TABS) 100 MG tablet Take 100 mg by mouth 2 (two) times daily.     ibuprofen (ADVIL) 800 MG tablet Take 800 mg by mouth 2 (two) times daily as needed.     linaclotide (LINZESS) 145 MCG CAPS capsule Take 145 mcg by mouth daily before breakfast.     No current facility-administered medications on file prior to visit.   No Known Allergies   Objective:  General: Alert and oriented x2 in no acute distress  Dermatology: No open lesions bilateral lower extremities, no webspace macerations, no ecchymosis bilateral, all nails x 10 are well manicured.  Hyperpigmented patch of skin noted to the medial foot and medial ankle questionable tinea versus rubbing in shoe causing irritation due to severe deformity on the left  Vascular: Dorsalis Pedis and Posterior Tibial pedal pulses 2/4, Capillary Fill Time 3 seconds, (+) pedal hair growth bilateral, no edema bilateral lower extremities, Temperature gradient within normal limits.  Neurology: Gross sensation intact via light touch bilateral.   Musculoskeletal: Minimal o reproducible tenderness to palpation posterior tibial tendon course and sinus tarsi of the left ankle, there is severe pes planus deformity with forefoot varus and hindfoot valgus, unchanged from prior with the left most  involved, limited ankle joint range of motion with significant equinus deformity noted bilateral.  X-rays consistent with pes planus deformity with midtarsal breech noted on the lateral view with anterior break in the cyma line  Assessment and Plan: Problem List Items Addressed This Visit   None Visit Diagnoses     Tendinitis    -  Primary   Relevant Orders   DG Foot Complete Left   Congenital pes planus, unspecified laterality       Acquired equinus deformity of both feet       Pain in left foot       Tinea       Relevant Medications   nystatin-triamcinolone ointment (MYCOLOG)      -Complete examination performed -X-rays reviewed -Discussed with mom likely pain due to foot type and overuse to posterior tibial tendon and impingement to ankle due to flatfoot deformity -Advised mom at this time since previous injections given by other foot doctor has not helped do not recommend to continue to give him injections at this time -Rx Flector patch for patient to use during the day for pain -Rx Mycolog ointment to use at any areas of discoloration or peeling skin that could possibly represent tinea -Rx [facility] for AFO on the left to provide additional control to the foot and ankle due to severe pes planus deformity which is causing pain and limiting ability to ambulate -Return to office if pain fails to continue to improve.  Landis Martins, DPM

## 2021-12-30 ENCOUNTER — Other Ambulatory Visit: Payer: Self-pay | Admitting: Sports Medicine

## 2021-12-30 ENCOUNTER — Telehealth: Payer: Self-pay | Admitting: Sports Medicine

## 2021-12-30 DIAGNOSIS — M65271 Calcific tendinitis, right ankle and foot: Secondary | ICD-10-CM

## 2021-12-30 NOTE — Telephone Encounter (Signed)
Pts mom called stating the pharmacy told them that the medication that was sent in yesterday needs a prior authorization

## 2022-01-03 NOTE — Telephone Encounter (Signed)
Patient's mom has been notified thru voice message.

## 2022-01-05 ENCOUNTER — Other Ambulatory Visit: Payer: Self-pay | Admitting: Sports Medicine

## 2022-01-05 MED ORDER — KETOCONAZOLE 2 % EX CREA: 1.0000 "application " | TOPICAL_CREAM | Freq: Every day | CUTANEOUS | 5 refills | Status: DC

## 2022-01-05 MED ORDER — DICLOFENAC SODIUM 1 % EX GEL: 4.0000 g | Freq: Four times a day (QID) | CUTANEOUS | 1 refills | Status: AC

## 2022-01-05 NOTE — Progress Notes (Signed)
Changed Flector patch to voltaren gel due to insurance coverage and changed mycolog cream to ketoconazole

## 2022-01-06 ENCOUNTER — Telehealth: Payer: Self-pay | Admitting: *Deleted

## 2022-01-06 NOTE — Telephone Encounter (Signed)
-----   Message from Asencion Islam, [place] sent at 01/05/2022  8:22 AM EDT ----- Regarding: Prior Auth We you let the mom know that I completed the prior auth. I am not sure if his insurance will approve it. I may take up to 7 days to get a reply from the insurance. I did go ahead and send Diclofenac gel to use in place of the flector patches for his foot pain and I sent Ketoconazole for him to use to the rash on the foot in case the mycolog ointment is not approved by the insurance

## 2022-01-06 NOTE — Telephone Encounter (Signed)
Called mom, no answer, left voice message from Dr Marylene Land of her recommendations/instructions.

## 2022-10-12 ENCOUNTER — Other Ambulatory Visit: Payer: Self-pay | Admitting: Internal Medicine

## 2022-10-13 LAB — COMPLETE METABOLIC PANEL WITH GFR
AG Ratio: 1.4 (calc) (ref 1.0–2.5)
ALT: 16 U/L (ref 9–46)
AST: 17 U/L (ref 10–40)
Albumin: 4.7 g/dL (ref 3.6–5.1)
Alkaline phosphatase (APISO): 91 U/L (ref 36–130)
BUN: 12 mg/dL (ref 7–25)
CO2: 24 mmol/L (ref 20–32)
Calcium: 9.1 mg/dL (ref 8.6–10.3)
Chloride: 104 mmol/L (ref 98–110)
Creat: 1.19 mg/dL (ref 0.60–1.26)
Globulin: 3.4 g/dL (calc) (ref 1.9–3.7)
Glucose, Bld: 90 mg/dL (ref 65–99)
Potassium: 3.9 mmol/L (ref 3.5–5.3)
Sodium: 141 mmol/L (ref 135–146)
Total Bilirubin: 0.7 mg/dL (ref 0.2–1.2)
Total Protein: 8.1 g/dL (ref 6.1–8.1)
eGFR: 82 mL/min/{1.73_m2} (ref >=60)

## 2022-10-13 LAB — LIPID PANEL
Cholesterol: 190 mg/dL (ref <200)
HDL: 43 mg/dL (ref >=40)
LDL Cholesterol (Calc): 111 mg/dL (calc) — ABNORMAL HIGH
Non-HDL Cholesterol (Calc): 147 mg/dL (calc) — ABNORMAL HIGH (ref <130)
Total CHOL/HDL Ratio: 4.4 (calc) (ref <5.0)
Triglycerides: 253 mg/dL — ABNORMAL HIGH (ref <150)

## 2022-10-13 LAB — CBC
HCT: 40.6 % (ref 38.5–50.0)
Hemoglobin: 14.1 g/dL (ref 13.2–17.1)
MCH: 30.6 pg (ref 27.0–33.0)
MCHC: 34.7 g/dL (ref 32.0–36.0)
MCV: 88.1 fL (ref 80.0–100.0)
MPV: 10.9 fL (ref 7.5–12.5)
Platelets: 226 10*3/uL (ref 140–400)
RBC: 4.61 10*6/uL (ref 4.20–5.80)
RDW: 12.4 % (ref 11.0–15.0)
WBC: 5.2 10*3/uL (ref 3.8–10.8)

## 2022-10-13 LAB — VITAMIN D 25 HYDROXY (VIT D DEFICIENCY, FRACTURES): Vit D, 25-Hydroxy: 34 ng/mL (ref 30–100)

## 2022-10-13 LAB — TSH: TSH: 3.01 mIU/L (ref 0.40–4.50)

## 2022-12-13 ENCOUNTER — Ambulatory Visit: Payer: Self-pay | Admitting: Dentistry

## 2022-12-13 ENCOUNTER — Other Ambulatory Visit: Payer: Self-pay

## 2022-12-13 ENCOUNTER — Encounter (HOSPITAL_COMMUNITY): Payer: Self-pay | Admitting: *Deleted

## 2022-12-13 DIAGNOSIS — F840 Autistic disorder: Secondary | ICD-10-CM

## 2022-12-13 NOTE — Progress Notes (Signed)
PCP - Fleet Contras, MD Cardiologist - denies  PPM/ICD - denies  CPAP - n/a  Fasting Blood Sugar - n/a  Blood Thinner Instructions: n/a Patient was instructed: As of today, STOP taking any Aspirin (unless otherwise instructed by your surgeon) Aleve, Naproxen, Ibuprofen, Motrin, Advil, Goody's, BC's, all herbal medications, fish oil, and all vitamins.  ERAS Protcol - yes, until 04:30 o'clock  COVID TEST- n/a  Anesthesia review: no  Patient verbally denies any shortness of breath, fever, cough and chest pain during phone call   -------------  SDW INSTRUCTIONS given:  Your procedure is scheduled on Thursday, May 9th,2024.  Report to [facility] Main Entrance "A" at 05:30 A.M., and check in at the Admitting office.  Call this number if you have problems the morning of surgery:  [phone]   Remember:  Do not eat after midnight the night before your surgery  You may drink clear liquids until 04:30 the morning of your surgery.   Clear liquids allowed are: Water, Non-Citrus Juices (without pulp), Carbonated Beverages, Clear Tea, Black Coffee Only, and Gatorade    Take these medicines the morning of surgery with A SIP OF WATER - NONE   The day of surgery:                     Do not wear jewelry,             Do not wear lotions, powders, colognes, or deodorant.            Men may shave face and neck.            Do not bring valuables to the hospital.            [facility] is not responsible for any belongings or valuables.  Do NOT Smoke (Tobacco/Vaping) 24 hours prior to your procedure If you use a CPAP at night, you may bring all equipment for your overnight stay.   Contacts, glasses, dentures or bridgework may not be worn into surgery.      For patients admitted to the hospital, discharge time will be determined by your treatment team.   Patients discharged the day of surgery will not be allowed to drive home, and someone needs to stay with them for 24  hours.    Special instructions:   [facility]- Preparing For Surgery  Before surgery, you can play an important role. Because skin is not sterile, your skin needs to be as free of germs as possible. You can reduce the number of germs on your skin by washing with CHG (chlorahexidine gluconate) Soap before surgery.  CHG is an antiseptic cleaner which kills germs and bonds with the skin to continue killing germs even after washing.    Oral Hygiene is also important to reduce your risk of infection.  Remember - BRUSH YOUR TEETH THE MORNING OF SURGERY WITH YOUR REGULAR TOOTHPASTE  Please do not use if you have an allergy to CHG or antibacterial soaps. If your skin becomes reddened/irritated stop using the CHG.  Do not shave (including legs and underarms) for at least 48 hours prior to first CHG shower. It is OK to shave your face.  Please follow these instructions carefully.   Shower the NIGHT BEFORE SURGERY and the MORNING OF SURGERY with DIAL Soap.   Pat yourself dry with a CLEAN TOWEL.  Wear CLEAN PAJAMAS to bed the night before surgery  Place CLEAN SHEETS on your bed the night of your first  shower and DO NOT SLEEP WITH PETS.   Day of Surgery: Please shower morning of surgery  Wear Clean/Comfortable clothing the morning of surgery Do not apply any deodorants/lotions.   Remember to brush your teeth WITH YOUR REGULAR TOOTHPASTE.   Questions were answered. Patient verbalized understanding of instructions.

## 2022-12-13 NOTE — Anesthesia Preprocedure Evaluation (Addendum)
Anesthesia Evaluation  Patient identified by MRN, date of birth, ID band Patient awake    Reviewed: Allergy & Precautions, H&P , NPO status , Patient's Chart, lab work & pertinent test results  Airway Mallampati: Unable to assess       Dental no notable dental hx. (+) Poor Dentition, Dental Advisory Given   Pulmonary neg pulmonary ROS   Pulmonary exam normal breath sounds clear to auscultation       Cardiovascular Exercise Tolerance: Good negative cardio ROS  Rhythm:Regular Rate:Normal     Neuro/Psych negative neurological ROS  negative psych ROS   GI/Hepatic negative GI ROS, Neg liver ROS,,,  Endo/Other  negative endocrine ROS    Renal/GU negative Renal ROS  negative genitourinary   Musculoskeletal   Abdominal   Peds  Hematology negative hematology ROS (+)   Anesthesia Other Findings   Reproductive/Obstetrics negative OB ROS                             Anesthesia Physical Anesthesia Plan  ASA: 3  Anesthesia Plan: General   Post-op Pain Management: Ofirmev IV (intra-op)* and Precedex   Induction: Intravenous  PONV Risk Score and Plan: 3 and Ondansetron, Dexamethasone and Midazolam  Airway Management Planned: Nasal ETT and Video Laryngoscope Planned  Additional Equipment:   Intra-op Plan:   Post-operative Plan: Extubation in OR  Informed Consent: I have reviewed the patients History and Physical, chart, labs and discussed the procedure including the risks, benefits and alternatives for the proposed anesthesia with the patient or authorized representative who has indicated his/her understanding and acceptance.     Dental advisory given  Plan Discussed with: CRNA  Anesthesia Plan Comments:        Anesthesia Quick Evaluation

## 2022-12-14 ENCOUNTER — Ambulatory Visit (HOSPITAL_COMMUNITY)
Admission: RE | Admit: 2022-12-14 | Discharge: 2022-12-14 | Disposition: A | Discharge status: Home / Self Care | Payer: Medicaid Other | Attending: Dentistry | Admitting: Dentistry

## 2022-12-14 ENCOUNTER — Encounter (HOSPITAL_COMMUNITY)
Admission: RE | Disposition: A | Discharge status: Home / Self Care | Payer: Self-pay | Source: Home / Self Care | Attending: Dentistry

## 2022-12-14 ENCOUNTER — Ambulatory Visit (HOSPITAL_BASED_OUTPATIENT_CLINIC_OR_DEPARTMENT_OTHER): Payer: Medicaid Other | Admitting: Certified Registered"

## 2022-12-14 ENCOUNTER — Other Ambulatory Visit: Payer: Self-pay

## 2022-12-14 ENCOUNTER — Ambulatory Visit (HOSPITAL_COMMUNITY): Payer: Medicaid Other | Admitting: Certified Registered"

## 2022-12-14 ENCOUNTER — Encounter (HOSPITAL_COMMUNITY): Payer: Self-pay | Admitting: *Deleted

## 2022-12-14 DIAGNOSIS — K029 Dental caries, unspecified: Secondary | ICD-10-CM

## 2022-12-14 DIAGNOSIS — F79 Unspecified intellectual disabilities: Secondary | ICD-10-CM | POA: Diagnosis not present

## 2022-12-14 DIAGNOSIS — F840 Autistic disorder: Secondary | ICD-10-CM

## 2022-12-14 HISTORY — PX: DENTAL RESTORATION/EXTRACTION WITH X-RAY: SHX5796

## 2022-12-14 HISTORY — DX: Other specified health status: Z78.9

## 2022-12-14 SURGERY — DENTAL RESTORATION/EXTRACTION WITH X-RAY
Anesthesia: General | Site: Mouth

## 2022-12-14 MED ORDER — DEXMEDETOMIDINE HCL IN NACL 80 MCG/20ML IV SOLN
INTRAVENOUS | Status: AC
  Filled 2022-12-14: qty 20

## 2022-12-14 MED ORDER — LIDOCAINE-EPINEPHRINE 2 %-1:100000 IJ SOLN
INTRAMUSCULAR | Status: DC | PRN
  Administered 2022-12-14: 1.7 mL

## 2022-12-14 MED ORDER — CHLORHEXIDINE GLUCONATE 0.12 % MT SOLN: 15.0000 mL | Freq: Once | OROMUCOSAL | Status: DC

## 2022-12-14 MED ORDER — MIDAZOLAM HCL 2 MG/ML PO SYRP: 20.0000 mg | ORAL_SOLUTION | Freq: Once | ORAL | Status: AC

## 2022-12-14 MED ORDER — ATROPINE SULFATE 0.4 MG/ML IV SOLN
INTRAVENOUS | Status: AC
  Filled 2022-12-14: qty 1

## 2022-12-14 MED ORDER — OXYMETAZOLINE HCL 0.05 % NA SOLN
NASAL | Status: AC
  Filled 2022-12-14: qty 30

## 2022-12-14 MED ORDER — DEXAMETHASONE SODIUM PHOSPHATE 10 MG/ML IJ SOLN
INTRAMUSCULAR | Status: AC
  Filled 2022-12-14: qty 1

## 2022-12-14 MED ORDER — FENTANYL CITRATE (PF) 100 MCG/2ML IJ SOLN: 25.0000 ug | INTRAMUSCULAR | Status: DC | PRN

## 2022-12-14 MED ORDER — ONDANSETRON HCL 4 MG/2ML IJ SOLN
INTRAMUSCULAR | Status: DC | PRN
  Administered 2022-12-14: 4 mg via INTRAVENOUS

## 2022-12-14 MED ORDER — DEXMEDETOMIDINE HCL IN NACL 200 MCG/50ML IV SOLN
INTRAVENOUS | Status: DC | PRN
  Administered 2022-12-14 (×2): 8 ug via INTRAVENOUS

## 2022-12-14 MED ORDER — ACETAMINOPHEN 10 MG/ML IV SOLN
INTRAVENOUS | Status: DC | PRN
  Administered 2022-12-14: 1000 mg via INTRAVENOUS

## 2022-12-14 MED ORDER — LACTATED RINGERS IV SOLN: INTRAVENOUS | Status: DC

## 2022-12-14 MED ORDER — PROPOFOL 10 MG/ML IV BOLUS
INTRAVENOUS | Status: AC
  Filled 2022-12-14: qty 20

## 2022-12-14 MED ORDER — STERILE WATER FOR IRRIGATION IR SOLN
Status: DC | PRN
  Administered 2022-12-14: 1000 mL

## 2022-12-14 MED ORDER — ONDANSETRON HCL 4 MG/2ML IJ SOLN
INTRAMUSCULAR | Status: AC
  Filled 2022-12-14: qty 2

## 2022-12-14 MED ORDER — LIDOCAINE-EPINEPHRINE 2 %-1:100000 IJ SOLN
INTRAMUSCULAR | Status: AC
  Filled 2022-12-14: qty 3.4

## 2022-12-14 MED ORDER — ACETAMINOPHEN 10 MG/ML IV SOLN
INTRAVENOUS | Status: AC
  Filled 2022-12-14: qty 100

## 2022-12-14 MED ORDER — PROPOFOL 10 MG/ML IV BOLUS
INTRAVENOUS | Status: DC | PRN
  Administered 2022-12-14: 150 mg via INTRAVENOUS

## 2022-12-14 MED ORDER — SUGAMMADEX SODIUM 200 MG/2ML IV SOLN
INTRAVENOUS | Status: DC | PRN
  Administered 2022-12-14: 200 mg via INTRAVENOUS

## 2022-12-14 MED ORDER — ORAL CARE MOUTH RINSE: 15.0000 mL | Freq: Once | OROMUCOSAL | Status: DC

## 2022-12-14 MED ORDER — HEMOSTATIC AGENTS (NO CHARGE) OPTIME
TOPICAL | Status: DC | PRN
  Administered 2022-12-14: 1

## 2022-12-14 MED ORDER — 0.9 % SODIUM CHLORIDE (POUR BTL) OPTIME
TOPICAL | Status: DC | PRN
  Administered 2022-12-14: 1000 mL

## 2022-12-14 MED ORDER — MIDAZOLAM HCL 2 MG/2ML IJ SOLN
INTRAMUSCULAR | Status: AC
  Filled 2022-12-14: qty 2

## 2022-12-14 MED ORDER — MIDAZOLAM HCL 2 MG/ML PO SYRP
ORAL_SOLUTION | ORAL | Status: AC
  Administered 2022-12-14: 20 mg via ORAL
  Filled 2022-12-14: qty 10

## 2022-12-14 MED ORDER — OXYMETAZOLINE HCL 0.05 % NA SOLN
NASAL | Status: AC
  Filled 2022-12-14: qty 60

## 2022-12-14 MED ORDER — FENTANYL CITRATE (PF) 250 MCG/5ML IJ SOLN
INTRAMUSCULAR | Status: DC | PRN
  Administered 2022-12-14: 100 ug via INTRAVENOUS
  Administered 2022-12-14: 25 ug via INTRAVENOUS

## 2022-12-14 MED ORDER — DEXAMETHASONE SODIUM PHOSPHATE 10 MG/ML IJ SOLN
INTRAMUSCULAR | Status: DC | PRN
  Administered 2022-12-14: 5 mg via INTRAVENOUS

## 2022-12-14 MED ORDER — ROCURONIUM BROMIDE 10 MG/ML (PF) SYRINGE
PREFILLED_SYRINGE | INTRAVENOUS | Status: DC | PRN
  Administered 2022-12-14: 20 mg via INTRAVENOUS
  Administered 2022-12-14: 50 mg via INTRAVENOUS

## 2022-12-14 MED ORDER — OXYMETAZOLINE HCL 0.05 % NA SOLN
NASAL | Status: DC | PRN
  Administered 2022-12-14: 1 via NASAL

## 2022-12-14 MED ORDER — PHENYLEPHRINE 80 MCG/ML (10ML) SYRINGE FOR IV PUSH (FOR BLOOD PRESSURE SUPPORT)
PREFILLED_SYRINGE | INTRAVENOUS | Status: DC | PRN
  Administered 2022-12-14 (×2): 80 ug via INTRAVENOUS

## 2022-12-14 MED ORDER — FENTANYL CITRATE (PF) 250 MCG/5ML IJ SOLN
INTRAMUSCULAR | Status: AC
  Filled 2022-12-14: qty 5

## 2022-12-14 SURGICAL SUPPLY — 26 items
BLADE SURG 15 STRL LF DISP TIS (BLADE) ×1 IMPLANT
BLADE SURG 15 STRL SS (BLADE) ×1
CANISTER SUCT 3000ML PPV (MISCELLANEOUS) ×1 IMPLANT
COVER BACK TABLE 60X90IN (DRAPES) ×1 IMPLANT
COVER MAYO STAND STRL (DRAPES) ×1 IMPLANT
COVER SURGICAL LIGHT HANDLE (MISCELLANEOUS) ×1 IMPLANT
DRAPE HALF SHEET 40X57 (DRAPES) ×1 IMPLANT
GAUZE PACKING FOLDED 2  STR (GAUZE/BANDAGES/DRESSINGS) ×1
GAUZE PACKING FOLDED 2 STR (GAUZE/BANDAGES/DRESSINGS) ×1 IMPLANT
GAUZE SPONGE 4X4 16PLY XRAY LF (GAUZE/BANDAGES/DRESSINGS) ×1 IMPLANT
GLOVE BIO SURGEON STRL SZ 6.5 (GLOVE) ×1 IMPLANT
GOWN STRL REUS W/ TWL LRG LVL3 (GOWN DISPOSABLE) ×2 IMPLANT
GOWN STRL REUS W/TWL LRG LVL3 (GOWN DISPOSABLE) ×2
KIT BASIN OR (CUSTOM PROCEDURE TRAY) ×1 IMPLANT
KIT TURNOVER KIT B (KITS) ×1 IMPLANT
NDL DENTAL 27 LONG (NEEDLE) ×1 IMPLANT
NEEDLE DENTAL 27 LONG (NEEDLE) ×1 IMPLANT
PAD ARMBOARD 7.5X6 YLW CONV (MISCELLANEOUS) ×2 IMPLANT
SPONGE SURGIFOAM ABS GEL SZ50 (HEMOSTASIS) IMPLANT
SUT CHROMIC 3 0 PS 2 (SUTURE) ×1 IMPLANT
SYR BULB IRRIG 60ML STRL (SYRINGE) ×1 IMPLANT
TOOTHBRUSH ADULT (PERSONAL CARE ITEMS) ×1 IMPLANT
TOWEL GREEN STERILE (TOWEL DISPOSABLE) ×1 IMPLANT
TUBE CONNECTING 12X1/4 (SUCTIONS) ×1 IMPLANT
WATER TABLETS ICX (MISCELLANEOUS) ×1 IMPLANT
YANKAUER SUCT BULB TIP NO VENT (SUCTIONS) ×1 IMPLANT

## 2022-12-14 NOTE — Anesthesia Procedure Notes (Addendum)
Procedure Name: Intubation Date/Time: 12/14/2022 7:40 AM  Performed by: Gus Puma, CRNAPre-anesthesia Checklist: Patient identified, Emergency Drugs available, Suction available, Patient being monitored and Timeout performed Patient Re-evaluated:Patient Re-evaluated prior to induction Oxygen Delivery Method: Circle system utilized and Simple face mask Preoxygenation: Pre-oxygenation with 100% oxygen Induction Type: IV induction Ventilation: Mask ventilation without difficulty Laryngoscope Size: Mac, 3 and Glidescope Grade View: Grade I Nasal Tubes: Nasal Rae Tube size: 6.5 mm Number of attempts: 1 Airway Equipment and Method: Patient positioned with wedge pillow and Video-laryngoscopy Placement Confirmation: positive ETCO2, ETT inserted through vocal cords under direct vision, CO2 detector and breath sounds checked- equal and bilateral Tube secured with: Tape Dental Injury: Teeth and Oropharynx as per pre-operative assessment  Comments: Inserted by Elana Alm SRNA w Cristal Deer CRNA and Sampson Goon MD

## 2022-12-14 NOTE — Op Note (Addendum)
[facility]  12/14/2022 Bobby Huber [id number]  Preop DX: Dental caries/behavior management issues due to intellectual disabilities/developmental disabilities. Dental Care provided in OR for medically necessary treatment.  Surgeon: Joanna Hews, DMD  Assistant: April Riggsbee and hospital staff.  Anesthesia: General  Procedure: The patient was brought into the operating room and placed on the table in a supine position.  General anesthesia was administered via nasal intubation.  The patient was prepped and draped in the usual manner for an intra-oral general dentistry procedure. The oropharynx was suctioned and a moistened oropharyngeal throat pack was placed.    A full intra-oral exam including all hard and soft tissues was performed.  Type of Exam: Recall   Soft Tissue Exam: Floor of the mouth: Normal Buccal mucosa: Normal Soft palate: Normal Hard palate: Normal Tongue: Normal Gingival: Normal Frenum: Normal  Hard tissue exam:  Present: # 3-11, 13-14, 19-29 Missing: # 1-2, 12, 15, 16, 17, 30-32 Impacted: # 6, 11 Radiographic findings decay: # 7, 29D   Full mouth series of digital radiographs taken and reviewed. A comprehensive treatment plan was developed.  Operative care was accomplished in a standard fashion using high/low speed drills with copious irrigation.  Routine extractions were accomplished with simple elevation and use of forceps.  Gel foam was placed in the sockets and hemostasis established with firm pressure. Surgical sites were closed with 3-0 Chromic sutures.  Local Anes:Lidocaine 2% with 1:100,000 epinephrine 1.7 mls The estimated blood loss was 10 mls.   Upon completion of all procedures the oropharynx was irrigated of all debris. Mouth was suctioned dry and a posterior throat pack was carefully removed with constant suction. Hemostasis was established and a gauze pack was placed as an intraoral pressure dressing. After spontaneous respirations  the patient was extubated and transported to the Post-Anesthesia care unit in awake but in a sedated condition. The patient tolerated the procedure well and without complications.  An explanation of procedures and extractions were given to mother.  Operative Procedures:  Prophy: Yes Extractions completed: # 7 Composites:#9DL(coronal/chewing/dentin), (coronal/chewing/dentin) Glass Ionomers: # 3O (coronal/chewing/dentin), (coronal/chewing/dentin), 5O (coronal/chewing/dentin), 14OL (coronal/chewing/dentin), 19O (coronal/chewing/dentin), 20O (coronal/chewing/dentin), 21O (coronal/chewing/dentin), 29DO (coronal/chewing/dentin) Fluoride varnish: Yes   Postoperative Meds:  OTC ibuprofen 600mg  and tylenol 500mg  every 6 hours if needed for pain  Postoperative Instructions: Extraction sheet signed and given to patient representative.    Joanna Hews, DMD

## 2022-12-14 NOTE — Anesthesia Postprocedure Evaluation (Signed)
Anesthesia Post Note  Patient: [employer]  Procedure(s) Performed: DENTAL CLEANING, FILLING OF TEETH 3O, , 5O, 9DL, , 14OL, 19O, 20O, 21O, 29DO AND EXTRACTION OF TOOTH #17 WITH X-RAY (Mouth)     Patient location during evaluation: PACU Anesthesia Type: General Level of consciousness: awake and alert Pain management: pain level controlled Vital Signs Assessment: post-procedure vital signs reviewed and stable Respiratory status: spontaneous breathing, nonlabored ventilation and respiratory function stable Cardiovascular status: blood pressure returned to baseline and stable Postop Assessment: no apparent nausea or vomiting Anesthetic complications: no  No notable events documented.  Last Vitals:  Vitals:   12/14/22 1030 12/14/22 1045  BP: 102/78 107/79  Pulse: 97 92  Resp: 12 15  Temp:  36.6 C  SpO2: 94% 95%    Last Pain:  Vitals:   12/14/22 1045  TempSrc:   PainSc: 0-No pain                 Trenten Watchman,W. EDMOND

## 2022-12-14 NOTE — H&P (Signed)
H&P reviewed. Stable for surgery Tamarah Bhullar H Darbie Biancardi, DMD  

## 2022-12-14 NOTE — Transfer of Care (Signed)
Immediate Anesthesia Transfer of Care Note  Patient: Bobby Huber  Procedure(s) Performed: DENTAL CLEANING, FILLING OF TEETH 3O, , 5O, 9DL, , 14OL, 19O, 20O, 21O, 29DO AND EXTRACTION OF TOOTH #17 WITH X-RAY (Mouth)  Patient Location: PACU  Anesthesia Type:General  Level of Consciousness: drowsy and patient cooperative  Airway & Oxygen Therapy: Patient Spontanous Breathing and Patient connected to face mask oxygen  Post-op Assessment: Report given to RN and Post -op Vital signs reviewed and stable  Post vital signs: Reviewed and stable  Last Vitals:  Vitals Value Taken Time  BP 115/66 12/14/22 1001  Temp    Pulse 110 12/14/22 1001  Resp 14 12/14/22 1001  SpO2 98 % 12/14/22 1001  Vitals shown include unvalidated device data.  Last Pain:  Vitals:   12/14/22 0600  TempSrc: Oral      Patients Stated Pain Goal: 0 (12/14/22 2952)  Complications: No notable events documented.

## 2022-12-14 NOTE — Brief Op Note (Signed)
12/14/2022  9:55 AM  PATIENT:  Bobby Huber  32 y.o. male  PRE-OPERATIVE DIAGNOSIS:  dental caries  POST-OPERATIVE DIAGNOSIS:  dental caries  PROCEDURE:   DENTAL CLEANING, FILLING OF TEETH 3O, , 5O, 9DL, , 14OL, 19O, 20O, 21O, 29DO AND EXTRACTION OF TOOTH #7 WITH X-RAY (N/A)  SURGEON:  Surgeon(s) and Role:    * Joanna Hews, DMD - Primary   ASSISTANTS: April Riggsbee   ANESTHESIA:   general  EBL:  10 mL   BLOOD ADMINISTERED:none  DRAINS: none   LOCAL MEDICATIONS USED:  LIDOCAINE  and Amount: 1.7 ml  SPECIMEN:  No Specimen  DISPOSITION OF SPECIMEN:  N/A  COUNTS:  YES  TOURNIQUET:  * No tourniquets in log *  DICTATION: .Dragon Dictation  PLAN OF CARE: Discharge to home after PACU  PATIENT DISPOSITION:  PACU - hemodynamically stable.   Delay start of Pharmacological VTE agent (>24hrs) due to surgical blood loss or risk of bleeding: not applicable

## 2022-12-15 ENCOUNTER — Encounter (HOSPITAL_COMMUNITY): Payer: Self-pay | Admitting: Dentistry

## 2023-02-22 ENCOUNTER — Other Ambulatory Visit: Payer: Self-pay

## 2024-06-09 ENCOUNTER — Other Ambulatory Visit: Payer: Self-pay | Admitting: Urology

## 2024-06-13 NOTE — Patient Instructions (Addendum)
 SURGICAL WAITING ROOM VISITATION  Patients having surgery or a procedure may have no more than 2 support people in the waiting area - these visitors may rotate.    Children under the age of 25 must have an adult with them who is not the patient.  Visitors with respiratory illnesses are discouraged from visiting and should remain at home.  If the patient needs to stay at the hospital during part of their recovery, the visitor guidelines for inpatient rooms apply. Pre-op nurse will coordinate an appropriate time for 1 support person to accompany patient in pre-op.  This support person may not rotate.    Please refer to the [facility] website for the visitor guidelines for Inpatients (after your surgery is over and you are in a regular room).    Your procedure is scheduled on: 06/26/24   Report to [facility] Main Entrance    Report to admitting at 1:25 PM   Call this number if you have problems the morning of surgery [phone]   Do not eat food or drink liquids :After Midnight.          If you have questions, please contact your surgeon's office.   FOLLOW BOWEL PREP AND ANY ADDITIONAL PRE OP INSTRUCTIONS YOU RECEIVED FROM YOUR SURGEON'S OFFICE!!!     Oral Hygiene is also important to reduce your risk of infection.                                    Remember - BRUSH YOUR TEETH THE MORNING OF SURGERY WITH YOUR REGULAR TOOTHPASTE  DENTURES WILL BE REMOVED PRIOR TO SURGERY PLEASE DO NOT APPLY Poly grip OR ADHESIVES!!!   Stop all vitamins and herbal supplements 7 days before surgery.   Take these medicines the morning of surgery with A SIP OF WATER : Sertraline                              You may not have any metal on your body including jewelry, and body piercing             Do not wear lotions, powders, cologne, or deodorant              Men may shave face and neck.   Do not bring valuables to the hospital. [place] IS NOT             RESPONSIBLE   FOR  VALUABLES.   Contacts, glasses, dentures or bridgework may not be worn into surgery.  DO NOT BRING YOUR HOME MEDICATIONS TO THE HOSPITAL. PHARMACY WILL DISPENSE MEDICATIONS LISTED ON YOUR MEDICATION LIST TO YOU DURING YOUR ADMISSION IN THE HOSPITAL!    Patients discharged on the day of surgery will not be allowed to drive home.  Someone NEEDS to stay with you for the first 24 hours after anesthesia.              Please read over the following fact sheets you were given: IF YOU HAVE QUESTIONS ABOUT YOUR PRE-OP INSTRUCTIONS PLEASE CALL [phone] [place].   If you received a COVID test during your pre-op visit  it is requested that you wear a mask when out in public, stay away from anyone that may not be feeling well and notify your surgeon if you develop symptoms. If you test positive for Covid or have been in contact  with anyone that has tested positive in the last 10 days please notify you surgeon.    [place] - Preparing for Surgery Before surgery, you can play an important role.  Because skin is not sterile, your skin needs to be as free of germs as possible.  You can reduce the number of germs on your skin by washing with CHG (chlorahexidine gluconate) soap before surgery.  CHG is an antiseptic cleaner which kills germs and bonds with the skin to continue killing germs even after washing. Please DO NOT use if you have an allergy to CHG or antibacterial soaps.  If your skin becomes reddened/irritated stop using the CHG and inform your nurse when you arrive at Short Stay. Do not shave (including legs and underarms) for at least 48 hours prior to the first CHG shower.  You may shave your face/neck.  Please follow these instructions carefully:  1.  Shower with CHG Soap the night before surgery and the morning of surgery.  2.  If you choose to wash your hair, wash your hair first as usual with your normal  shampoo.  3.  After you shampoo, rinse your hair and body thoroughly to remove the  shampoo.                             4.  Use CHG as you would any other liquid soap.  You can apply chg directly to the skin and wash.  Gently with a scrungie or clean washcloth.  5.  Apply the CHG Soap to your body ONLY FROM THE NECK DOWN.   Do   not use on face/ open                           Wound or open sores. Avoid contact with eyes, ears mouth and   genitals (private parts).                       Wash face,  Genitals (private parts) with your normal soap.             6.  Wash thoroughly, paying special attention to the area where your    surgery  will be performed.  7.  Thoroughly rinse your body with warm water  from the neck down.  8.  DO NOT shower/wash with your normal soap after using and rinsing off the CHG Soap.                9.  Pat yourself dry with a clean towel.            10.  Wear clean pajamas.            11.  Place clean sheets on your bed the night of your first shower and do not  sleep with pets. Day of Surgery : Do not apply any CHG, lotions/deodorants the morning of surgery.  Please wear clean clothes to the hospital/surgery center.  FAILURE TO FOLLOW THESE INSTRUCTIONS MAY RESULT IN THE CANCELLATION OF YOUR SURGERY  PATIENT SIGNATURE_________________________________  NURSE SIGNATURE__________________________________  ________________________________________________________________________

## 2024-06-13 NOTE — Progress Notes (Addendum)
 Patient has autism. Per mother he does not do well with a lot of beeping or multiple people in the room. It makes him very anxious  Completed PAT appointment over the phone with legal guardian, Fronie Lindau (mother). Will get labs done DOS as patient has a hard time with lab draw and new places. Transferred call to admitting. Emailed instructions to preferred email. Mother states she can come pick up soap before surgery, instructed her to call before coming.  Date of COVID positive in last 90 days:  PCP - Aliene Colon, MD Cardiologist - n/a  Chest x-ray - N/A EKG - N/A Stress Test - N/A ECHO - N/A Cardiac Cath - N/A Pacemaker/ICD device last checked:N/A Spinal Cord Stimulator:N/A  Bowel Prep - N/A  Sleep Study - N/A CPAP -   Fasting Blood Sugar - N/A Checks Blood Sugar _____ times a day  Last dose of GLP1 agonist-  N/A GLP1 instructions:  Do not take after     Last dose of SGLT-2 inhibitors-  N/A SGLT-2 instructions:  Do not take after     Blood Thinner Instructions: N/A Last dose:   Time: Aspirin Instructions:N/A Last Dose:  Activity level: Can go up a flight of stairs and perform activities of daily living without stopping and without symptoms of chest pain or shortness of breath.  Anesthesia review: N/A  Patient denies shortness of breath, fever, cough and chest pain at PAT appointment  Patient verbalized understanding of instructions that were given to them at the PAT appointment. Patient was also instructed that they will need to review over the PAT instructions again at home before surgery.

## 2024-06-17 ENCOUNTER — Encounter (HOSPITAL_COMMUNITY): Payer: Self-pay

## 2024-06-17 ENCOUNTER — Encounter (HOSPITAL_COMMUNITY)
Admission: RE | Admit: 2024-06-17 | Discharge: 2024-06-17 | Disposition: A | Discharge status: Home / Self Care | Payer: MEDICAID | Source: Ambulatory Visit | Attending: Urology | Admitting: Urology

## 2024-06-17 ENCOUNTER — Other Ambulatory Visit: Payer: Self-pay

## 2024-06-17 VITALS — Ht 67.0 in | Wt 198.0 lb

## 2024-06-17 DIAGNOSIS — Z01818 Encounter for other preprocedural examination: Secondary | ICD-10-CM

## 2024-06-17 HISTORY — DX: Anxiety disorder, unspecified: F41.9

## 2024-06-17 HISTORY — DX: Pneumonia, unspecified organism: J18.9

## 2024-06-19 ENCOUNTER — Encounter (HOSPITAL_COMMUNITY): Payer: MEDICAID

## 2024-06-24 NOTE — H&P (Signed)
 H&P  Chief Complaint: Irregularity of bladder  History of Present Illness:  [address] M OSH CT shows non obstructing upper pole stone. Having dysuria on and off.  PMH: intelectual disability  PSH: no surgeries  Irregular mass in bladder on CT: 06/06/24: irregular mass on bladder. no upper tract issues. Dysuria: 06/06/24: penis hurts when urinating. Pt is circumcised. 3-4 times. exam normal. Small meatus  Past Medical History:  Diagnosis Date   Anxiety    Difficulty swallowing pills    Mass 04/07/2014   bilateral ear lobe   Medical history non-contributory    Mental retardation    Pneumonia    once   Speech impediment    speech is not clear, difficult to understand   Past Surgical History:  Procedure Laterality Date   DENTAL RESTORATION/EXTRACTION WITH X-RAY N/A 12/14/2022   Procedure: DENTAL CLEANING, FILLING OF TEETH 3O, , 5O, 9DL, , 14OL, 19O, 20O, 21O, 29DO AND EXTRACTION OF TOOTH #17 WITH X-RAY;  Surgeon: Bonnell Margarito DEL, DMD;  Location: [facility] OR;  Service: Dentistry;  Laterality: N/A;   MASS EXCISION Bilateral 04/20/2014   Procedure: EXCISION OF  MASSES BILATERAL POSTERIOR EAR LOBE WITH PLASTIC CLOSURE;  Surgeon: Elna Pick, MD;  Location: [facility];  Service: Plastics;  Laterality: Bilateral;   MULTIPLE TOOTH EXTRACTIONS      Home Medications:  No medications prior to admission.   Allergies: No Known Allergies  Family History  Problem Relation Age of Onset   Healthy Mother    Hypertension Other    Heart attack Other    Heart attack Other    Hypertension Other    Cancer Maternal Grandmother    Cancer Maternal Aunt    Cancer Maternal Uncle    Cancer Paternal Aunt    Cancer Cousin    Cancer Cousin    Hypertension Maternal Grandfather    Social History:  reports that he has never smoked. He has been exposed to tobacco smoke. He has never used smokeless tobacco. He reports that he does not drink alcohol and does not use drugs.  ROS: A  complete review of systems was performed.  All systems are negative except for pertinent findings as noted. ROS   Physical Exam:  Vital signs in last 24 hours:   General: NAD Respiratory: normal WOB on RA Cards: RRR per monitor   Laboratory Data:  No results found for this or any previous visit (from the past 24 hours). No results found for this or any previous visit (from the past 240 hours). Creatinine: No results for input(s): CREATININE in the last 168 hours.  Impression/Assessment:  Irregularity in the bladder: Will request imaging , plan for cystoscopy as well Dysuria: Near meatus concerned that near meatus is causing discomfort will plan for cystoscopy patient is very hesitant about catheter. We discussed risk benefits alternatives to cystoscopy including bleed infection demonstrating structures including the urethra possible need for catheter patient's mother voiced understanding will proceed.  Cystoscopy today Ucx negative   Plan:  Proceed with cysto today   Bobby Huber 06/24/2024, 4:04 PM

## 2024-06-26 ENCOUNTER — Ambulatory Visit (HOSPITAL_COMMUNITY): Payer: MEDICAID | Admitting: Anesthesiology

## 2024-06-26 ENCOUNTER — Ambulatory Visit (HOSPITAL_COMMUNITY)
Admission: RE | Admit: 2024-06-26 | Discharge: 2024-06-26 | Disposition: A | Discharge status: Home / Self Care | Payer: MEDICAID | Attending: Urology | Admitting: Urology

## 2024-06-26 ENCOUNTER — Encounter (HOSPITAL_COMMUNITY): Payer: Self-pay | Admitting: Urology

## 2024-06-26 ENCOUNTER — Encounter (HOSPITAL_COMMUNITY)
Admission: RE | Disposition: A | Discharge status: Home / Self Care | Payer: Self-pay | Source: Home / Self Care | Attending: Urology

## 2024-06-26 ENCOUNTER — Other Ambulatory Visit: Payer: Self-pay

## 2024-06-26 DIAGNOSIS — Z87440 Personal history of urinary (tract) infections: Secondary | ICD-10-CM | POA: Insufficient documentation

## 2024-06-26 DIAGNOSIS — Q909 Down syndrome, unspecified: Secondary | ICD-10-CM | POA: Insufficient documentation

## 2024-06-26 DIAGNOSIS — R9349 Abnormal radiologic findings on diagnostic imaging of other urinary organs: Secondary | ICD-10-CM | POA: Diagnosis present

## 2024-06-26 DIAGNOSIS — Z01818 Encounter for other preprocedural examination: Secondary | ICD-10-CM

## 2024-06-26 DIAGNOSIS — F419 Anxiety disorder, unspecified: Secondary | ICD-10-CM

## 2024-06-26 DIAGNOSIS — R310 Gross hematuria: Secondary | ICD-10-CM | POA: Insufficient documentation

## 2024-06-26 DIAGNOSIS — R300 Dysuria: Secondary | ICD-10-CM | POA: Insufficient documentation

## 2024-06-26 DIAGNOSIS — Z7722 Contact with and (suspected) exposure to environmental tobacco smoke (acute) (chronic): Secondary | ICD-10-CM | POA: Diagnosis not present

## 2024-06-26 HISTORY — PX: CYSTOSCOPY: SHX5120

## 2024-06-26 LAB — CBC
HCT: 43.2 % (ref 39.0–52.0)
Hemoglobin: 14.8 g/dL (ref 13.0–17.0)
MCH: 30.6 pg (ref 26.0–34.0)
MCHC: 34.3 g/dL (ref 30.0–36.0)
MCV: 89.4 fL (ref 80.0–100.0)
Platelets: 242 K/uL (ref 150–400)
RBC: 4.83 MIL/uL (ref 4.22–5.81)
RDW: 11.9 % (ref 11.5–15.5)
WBC: 6.5 K/uL (ref 4.0–10.5)
nRBC: 0 % (ref 0.0–0.2)

## 2024-06-26 SURGERY — CYSTOSCOPY
Anesthesia: Monitor Anesthesia Care | Site: Urethra

## 2024-06-26 MED ORDER — OXYCODONE HCL 5 MG/5ML PO SOLN
ORAL | Status: AC
  Filled 2024-06-26: qty 5

## 2024-06-26 MED ORDER — MIDAZOLAM HCL 2 MG/2ML IJ SOLN
INTRAMUSCULAR | Status: AC
  Filled 2024-06-26: qty 2

## 2024-06-26 MED ORDER — MIDAZOLAM HCL (PF) 2 MG/2ML IJ SOLN
0.5000 mg | Freq: Once | INTRAMUSCULAR | Status: AC | PRN
  Administered 2024-06-26: 1 mg via INTRAVENOUS

## 2024-06-26 MED ORDER — CHLORHEXIDINE GLUCONATE 0.12 % MT SOLN
15.0000 mL | Freq: Once | OROMUCOSAL | Status: AC
  Administered 2024-06-26: 15 mL via OROMUCOSAL

## 2024-06-26 MED ORDER — ONDANSETRON HCL 4 MG/2ML IJ SOLN
INTRAMUSCULAR | Status: DC | PRN
  Administered 2024-06-26: 4 mg via INTRAVENOUS

## 2024-06-26 MED ORDER — FENTANYL CITRATE (PF) 100 MCG/2ML IJ SOLN
INTRAMUSCULAR | Status: AC
  Filled 2024-06-26: qty 2

## 2024-06-26 MED ORDER — MIDAZOLAM HCL 5 MG/5ML IJ SOLN
INTRAMUSCULAR | Status: DC | PRN
  Administered 2024-06-26: 2 mg via INTRAVENOUS

## 2024-06-26 MED ORDER — FENTANYL CITRATE (PF) 50 MCG/ML IJ SOSY
PREFILLED_SYRINGE | INTRAMUSCULAR | Status: AC
  Filled 2024-06-26: qty 1

## 2024-06-26 MED ORDER — GLYCOPYRROLATE PF 0.2 MG/ML IJ SOSY
PREFILLED_SYRINGE | INTRAMUSCULAR | Status: DC | PRN
  Administered 2024-06-26: .2 mg via INTRAVENOUS

## 2024-06-26 MED ORDER — SODIUM CHLORIDE 0.9 % IR SOLN
Status: DC | PRN
  Administered 2024-06-26: 3000 mL

## 2024-06-26 MED ORDER — PHENAZOPYRIDINE HCL 200 MG PO TABS: 200.0000 mg | ORAL_TABLET | Freq: Three times a day (TID) | ORAL | 0 refills | Status: AC | PRN

## 2024-06-26 MED ORDER — MEPERIDINE HCL 25 MG/ML IJ SOLN: 6.2500 mg | INTRAMUSCULAR | Status: DC | PRN

## 2024-06-26 MED ORDER — OXYCODONE HCL 5 MG/5ML PO SOLN
5.0000 mg | Freq: Once | ORAL | Status: AC | PRN
  Administered 2024-06-26: 5 mg via ORAL

## 2024-06-26 MED ORDER — FENTANYL CITRATE (PF) 50 MCG/ML IJ SOSY: 25.0000 ug | PREFILLED_SYRINGE | INTRAMUSCULAR | Status: DC | PRN

## 2024-06-26 MED ORDER — CEFAZOLIN SODIUM-DEXTROSE 2-4 GM/100ML-% IV SOLN
INTRAVENOUS | Status: AC
  Filled 2024-06-26: qty 100

## 2024-06-26 MED ORDER — PROPOFOL 500 MG/50ML IV EMUL
INTRAVENOUS | Status: DC | PRN
  Administered 2024-06-26 (×2): 50 mg via INTRAVENOUS
  Administered 2024-06-26: 200 ug/kg/min via INTRAVENOUS
  Administered 2024-06-26 (×2): 50 mg via INTRAVENOUS
  Administered 2024-06-26: 100 mg via INTRAVENOUS

## 2024-06-26 MED ORDER — ACETAMINOPHEN 500 MG PO TABS: 1000.0000 mg | ORAL_TABLET | Freq: Once | ORAL | Status: DC

## 2024-06-26 MED ORDER — ORAL CARE MOUTH RINSE: 15.0000 mL | Freq: Once | OROMUCOSAL | Status: AC

## 2024-06-26 MED ORDER — LACTATED RINGERS IV SOLN: INTRAVENOUS | Status: DC

## 2024-06-26 MED ORDER — CEFAZOLIN SODIUM-DEXTROSE 2-4 GM/100ML-% IV SOLN
2.0000 g | INTRAVENOUS | Status: AC
  Administered 2024-06-26: 2 g via INTRAVENOUS

## 2024-06-26 MED ORDER — OXYCODONE HCL 5 MG PO TABS: 5.0000 mg | ORAL_TABLET | Freq: Once | ORAL | Status: AC | PRN

## 2024-06-26 SURGICAL SUPPLY — 15 items
BAG COUNTER SPONGE SURGICOUNT (BAG) IMPLANT
BAG URO CATCHER STRL LF (MISCELLANEOUS) ×1 IMPLANT
CATH FOLEY 2W COUNCIL 5CC 16FR (CATHETERS) IMPLANT
CATH URETL OPEN 5X70 (CATHETERS) IMPLANT
CLOTH BEACON ORANGE TIMEOUT ST (SAFETY) ×1 IMPLANT
CYSTOSCOPE 4 SCOPE REVERSE (DISPOSABLE) IMPLANT
GLOVE BIO SURGEON STRL SZ8 (GLOVE) ×1 IMPLANT
GOWN STRL REUS W/ TWL XL LVL3 (GOWN DISPOSABLE) ×1 IMPLANT
GUIDEWIRE STR DUAL SENSOR (WIRE) ×1 IMPLANT
KIT TURNOVER KIT A (KITS) ×1 IMPLANT
MANIFOLD NEPTUNE II (INSTRUMENTS) ×1 IMPLANT
NS IRRIG 1000ML POUR BTL (IV SOLUTION) IMPLANT
PACK CYSTO (CUSTOM PROCEDURE TRAY) ×1 IMPLANT
TRACTIP FLEXIVA PULS ID 200XHI (Laser) IMPLANT
TUBING CONNECTING 10 (TUBING) ×1 IMPLANT

## 2024-06-26 NOTE — Anesthesia Procedure Notes (Signed)
 Procedure Name: MAC Date/Time: 06/26/2024 4:27 PM  Performed by: Nada Corean CROME, CRNAPre-anesthesia Checklist: Patient identified, Emergency Drugs available, Suction available, Patient being monitored and Timeout performed Patient Re-evaluated:Patient Re-evaluated prior to induction Oxygen Delivery Method: Simple face mask Preoxygenation: Pre-oxygenation with 100% oxygen Induction Type: IV induction Placement Confirmation: positive ETCO2 Dental Injury: Teeth and Oropharynx as per pre-operative assessment

## 2024-06-26 NOTE — Transfer of Care (Signed)
 Immediate Anesthesia Transfer of Care Note  Patient: Bobby Huber  Procedure(s) Performed: CYSTOSCOPY (Urethra)  Patient Location: PACU  Anesthesia Type:MAC  Level of Consciousness: awake, alert , and patient cooperative  Airway & Oxygen Therapy: Patient Spontanous Breathing  Post-op Assessment: Report given to RN and Post -op Vital signs reviewed and stable  Post vital signs: Reviewed and stable  Last Vitals:  Vitals Value Taken Time  BP 95/60   Temp    Pulse 126   Resp 23 06/26/24 17:04  SpO2 94   Vitals shown include unfiled device data.  Last Pain:  Vitals:   06/26/24 1348  TempSrc: Oral  PainSc: 0-No pain         Complications: No notable events documented.

## 2024-06-26 NOTE — Discharge Instructions (Addendum)
 Cystoscopy postop  Symptoms to expect postop: Some burning with urination Some blood in the urine Some urgency and frequency The symptoms should last for 5 days  When to call your doctor: Fevers greater than 100.4 Blood in the urine that does not remit after 10 days  Medications: Pyridium  this will make the urine orange  Follow-up: You will be scheduled for follow-up we will unable to obtain your CT from OSH will repeat CT prior to follow-up

## 2024-06-26 NOTE — Anesthesia Preprocedure Evaluation (Signed)
 Anesthesia Evaluation   Patient awake    Reviewed: Allergy & Precautions, NPO status , Patient's Chart, lab work & pertinent test results  History of Anesthesia Complications Negative for: history of anesthetic complications  Airway Mallampati: III  TM Distance: >3 FB Neck ROM: Full    Dental  (+) Poor Dentition, Dental Advisory Given   Pulmonary neg pulmonary ROS   Pulmonary exam normal        Cardiovascular negative cardio ROS  Rhythm:Regular Rate:Normal     Neuro/Psych   Anxiety     Mentally challenged, mother is guardiannegative neurological ROS     GI/Hepatic negative GI ROS, Neg liver ROS,,,  Endo/Other  negative endocrine ROS    Renal/GU negative Renal ROS     Musculoskeletal   Abdominal   Peds  Hematology Hb 14.8, plt 242k   Anesthesia Other Findings   Reproductive/Obstetrics                              Anesthesia Physical Anesthesia Plan  ASA: 3  Anesthesia Plan: MAC   Post-op Pain Management: Tylenol  PO (pre-op)*   Induction: Intravenous  PONV Risk Score and Plan: 1 and Ondansetron  and Treatment may vary due to age or medical condition  Airway Management Planned: Natural Airway and Simple Face Mask  Additional Equipment: None  Intra-op Plan:   Post-operative Plan:   Informed Consent: I have reviewed the patients History and Physical, chart, labs and discussed the procedure including the risks, benefits and alternatives for the proposed anesthesia with the patient or authorized representative who has indicated his/her understanding and acceptance.     Consent reviewed with POA and Dental advisory given  Plan Discussed with: CRNA and Surgeon  Anesthesia Plan Comments:         Anesthesia Quick Evaluation

## 2024-06-26 NOTE — Op Note (Signed)
 Procedure: Cystoscopy  Prior to diagnosis: Gross hematuria  Postoperative diagnosis: Same  Operative findings: No tumors stones or other irregularities within the bladder no stricture in the urethra nonobstructing urethra. There was a large bulge into the bladder on the posterior bladder wall this discoloring normal mucosa did not appear malignant.  Anesthesia: MAC  Antibiotics: Ancef   Fluids: Per anesthesia  EBL: Minimal  Specimens: None  Indication for procedure: Mr. Bobby Huber is a 33 year old male with Down syndrome unable to tolerate cystoscopy in office he he is brought in today to evaluate recent episode of gross hematuria and recurrent urinary tract infections to make sure there is no anatomical abnormalities.  Procedure in detail: After informed consent was confirmed in the preoperative area patient was taken to the operative suite he is placed under MAC anesthesia by the anesthesia team.  We then performed timeout verifying correct patient procedure  Next the flexible cystoscope was introduced into the urethra there were no strictures or other abnormalities within the urethra.  Cystoscopy was then performed the prostate was nonobstructing there were no tumors stones or other abnormalities within the bladder there was a mound on the posterior bladder wall that was covered in normal mucosa that was not concerning for malignancy we will evaluate this with a CT.  Scope was then removed the bladder was drained patient was awoken in the operative suite and taken the PACU in stable condition.  Disposition: Patient will be set up for follow-up with a CT prior

## 2024-06-26 NOTE — Anesthesia Postprocedure Evaluation (Signed)
 Anesthesia Post Note  Patient: [employer]  Procedure(s) Performed: CYSTOSCOPY (Urethra)     Patient location during evaluation: PACU Anesthesia Type: MAC Level of consciousness: awake and alert Pain management: pain level controlled Vital Signs Assessment: post-procedure vital signs reviewed and stable Respiratory status: spontaneous breathing, nonlabored ventilation, respiratory function stable and patient connected to nasal cannula oxygen Cardiovascular status: stable and blood pressure returned to baseline Postop Assessment: no apparent nausea or vomiting Anesthetic complications: no   No notable events documented.  Last Vitals:  Vitals:   06/26/24 1703 06/26/24 1715  BP: 95/60 120/68  Pulse:  (!) 107  Resp: (!) 24 18  Temp: (!) 36.3 C   SpO2: 95% 95%    Last Pain:  Vitals:   06/26/24 1703  TempSrc: Axillary  PainSc:                  R[place] Ade

## 2024-06-27 ENCOUNTER — Encounter (HOSPITAL_COMMUNITY): Payer: Self-pay | Admitting: Urology
